# Patient Record
Sex: Male | Born: 1990 | Race: Black or African American | Hispanic: No | Marital: Single | Smoking: Current every day smoker
Health system: Southern US, Community
[De-identification: ages and names within clinical notes are randomized; demographics above are authoritative.]

## PROBLEM LIST (undated history)

## (undated) DIAGNOSIS — I10 Essential (primary) hypertension: Secondary | ICD-10-CM

---
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
## 2008-03-23 ENCOUNTER — Emergency Department (HOSPITAL_COMMUNITY): Admission: EM | Admit: 2008-03-23 | Discharge: 2008-03-23 | Payer: Self-pay | Admitting: Emergency Medicine

## 2008-05-10 ENCOUNTER — Emergency Department (HOSPITAL_COMMUNITY): Admission: EM | Admit: 2008-05-10 | Discharge: 2008-05-10 | Payer: Self-pay | Admitting: Family Medicine

## 2008-06-08 ENCOUNTER — Emergency Department (HOSPITAL_COMMUNITY): Admission: EM | Admit: 2008-06-08 | Discharge: 2008-06-08 | Payer: Self-pay | Admitting: Family Medicine

## 2008-06-09 ENCOUNTER — Emergency Department (HOSPITAL_COMMUNITY): Admission: EM | Admit: 2008-06-09 | Discharge: 2008-06-09 | Payer: Self-pay | Admitting: Emergency Medicine

## 2008-06-11 ENCOUNTER — Emergency Department (HOSPITAL_COMMUNITY): Admission: EM | Admit: 2008-06-11 | Discharge: 2008-06-11 | Payer: Self-pay | Admitting: Family Medicine

## 2010-03-20 ENCOUNTER — Emergency Department (HOSPITAL_COMMUNITY): Admission: EM | Admit: 2010-03-20 | Discharge: 2010-03-20 | Payer: Self-pay | Admitting: Emergency Medicine

## 2011-02-06 ENCOUNTER — Inpatient Hospital Stay (INDEPENDENT_AMBULATORY_CARE_PROVIDER_SITE_OTHER)
Admission: RE | Admit: 2011-02-06 | Discharge: 2011-02-06 | Disposition: A | Discharge status: Home / Self Care | Payer: Self-pay | Source: Ambulatory Visit | Attending: Emergency Medicine | Admitting: Emergency Medicine

## 2011-02-06 ENCOUNTER — Ambulatory Visit (INDEPENDENT_AMBULATORY_CARE_PROVIDER_SITE_OTHER): Payer: Self-pay

## 2011-02-06 DIAGNOSIS — J111 Influenza due to unidentified influenza virus with other respiratory manifestations: Secondary | ICD-10-CM

## 2011-02-06 LAB — POCT RAPID STREP A (OFFICE): Streptococcus, Group A Screen (Direct): NEGATIVE

## 2011-09-02 LAB — CULTURE, ROUTINE-ABSCESS: Gram Stain: NONE SEEN

## 2011-09-04 LAB — HERPES SIMPLEX VIRUS CULTURE

## 2011-09-04 LAB — RPR: RPR Ser Ql: NONREACTIVE

## 2012-07-19 ENCOUNTER — Emergency Department (HOSPITAL_COMMUNITY)
Admission: EM | Admit: 2012-07-19 | Discharge: 2012-07-19 | Disposition: A | Discharge status: Home / Self Care | Payer: No Typology Code available for payment source

## 2012-07-19 ENCOUNTER — Emergency Department (HOSPITAL_COMMUNITY): Payer: No Typology Code available for payment source

## 2012-07-19 ENCOUNTER — Emergency Department (HOSPITAL_COMMUNITY)
Admission: EM | Admit: 2012-07-19 | Discharge: 2012-07-19 | Disposition: A | Discharge status: Home / Self Care | Payer: No Typology Code available for payment source | Attending: Emergency Medicine | Admitting: Emergency Medicine

## 2012-07-19 ENCOUNTER — Encounter (HOSPITAL_COMMUNITY): Payer: Self-pay | Admitting: Emergency Medicine

## 2012-07-19 DIAGNOSIS — Y92410 Unspecified street and highway as the place of occurrence of the external cause: Secondary | ICD-10-CM | POA: Insufficient documentation

## 2012-07-19 DIAGNOSIS — M79609 Pain in unspecified limb: Secondary | ICD-10-CM | POA: Insufficient documentation

## 2012-07-19 DIAGNOSIS — M79642 Pain in left hand: Secondary | ICD-10-CM

## 2012-07-19 DIAGNOSIS — I10 Essential (primary) hypertension: Secondary | ICD-10-CM | POA: Insufficient documentation

## 2012-07-19 DIAGNOSIS — F17200 Nicotine dependence, unspecified, uncomplicated: Secondary | ICD-10-CM | POA: Insufficient documentation

## 2012-07-19 HISTORY — DX: Essential (primary) hypertension: I10

## 2012-07-19 MED ORDER — IBUPROFEN 400 MG PO TABS
800.0000 mg | ORAL_TABLET | Freq: Once | ORAL | Status: AC
  Administered 2012-07-19: 800 mg via ORAL
  Filled 2012-07-19: qty 2

## 2012-07-19 MED ORDER — IBUPROFEN 800 MG PO TABS
800.0000 mg | ORAL_TABLET | Freq: Three times a day (TID) | ORAL | Status: AC | PRN
Start: 2012-07-19 — End: 2012-07-29

## 2012-07-19 NOTE — Progress Notes (Signed)
Orthopedic Tech Progress Note Patient Details:  Sean Deleon 08/23/1991 [id number]  Ortho Devices Type of Ortho Device: Finger splint Ortho Device/Splint Location: left hand;finger splint times two Ortho Device/Splint Interventions: Application   [name] 07/19/2012, 9:05 PM

## 2012-07-19 NOTE — ED Notes (Signed)
Called x1 for triage from lobby. Pt not seen. Will call again
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
## 2012-07-20 NOTE — ED Provider Notes (Signed)
Medical screening examination/treatment/procedure(s) were performed by non-physician practitioner and as supervising physician I was immediately available for consultation/collaboration.  Flint Melter, MD 07/20/12 [phone]

## 2015-01-23 ENCOUNTER — Encounter (HOSPITAL_COMMUNITY): Payer: Self-pay | Admitting: *Deleted

## 2015-01-23 ENCOUNTER — Emergency Department (HOSPITAL_COMMUNITY)
Admission: EM | Admit: 2015-01-23 | Discharge: 2015-01-23 | Disposition: A | Discharge status: Home / Self Care | Payer: Self-pay | Attending: Emergency Medicine | Admitting: Emergency Medicine

## 2015-01-23 DIAGNOSIS — R197 Diarrhea, unspecified: Secondary | ICD-10-CM | POA: Insufficient documentation

## 2015-01-23 DIAGNOSIS — I10 Essential (primary) hypertension: Secondary | ICD-10-CM | POA: Insufficient documentation

## 2015-01-23 DIAGNOSIS — Z880 Allergy status to penicillin: Secondary | ICD-10-CM | POA: Insufficient documentation

## 2015-01-23 DIAGNOSIS — Z720 Tobacco use: Secondary | ICD-10-CM | POA: Insufficient documentation

## 2015-01-23 DIAGNOSIS — R1030 Lower abdominal pain, unspecified: Secondary | ICD-10-CM | POA: Insufficient documentation

## 2015-01-23 DIAGNOSIS — R112 Nausea with vomiting, unspecified: Secondary | ICD-10-CM | POA: Insufficient documentation

## 2015-01-23 LAB — COMPREHENSIVE METABOLIC PANEL
ALK PHOS: 63 U/L (ref 39–117)
ALT: 32 U/L (ref 0–53)
AST: 23 U/L (ref 0–37)
Albumin: 4.4 g/dL (ref 3.5–5.2)
Anion gap: 8 (ref 5–15)
BILIRUBIN TOTAL: 1.9 mg/dL — AB (ref 0.3–1.2)
BUN: 10 mg/dL (ref 6–23)
CHLORIDE: 109 mmol/L (ref 96–112)
CO2: 23 mmol/L (ref 19–32)
Calcium: 9.1 mg/dL (ref 8.4–10.5)
Creatinine, Ser: 0.79 mg/dL (ref 0.50–1.35)
GFR calc Af Amer: >90 mL/min (ref >90)
GLUCOSE: 103 mg/dL — AB (ref 70–99)
Potassium: 3.6 mmol/L (ref 3.5–5.1)
SODIUM: 140 mmol/L (ref 135–145)
Total Protein: 7.3 g/dL (ref 6.0–8.3)

## 2015-01-23 LAB — CBC
HEMATOCRIT: 44.5 % (ref 39.0–52.0)
HEMOGLOBIN: 16.2 g/dL (ref 13.0–17.0)
MCH: 31.2 pg (ref 26.0–34.0)
MCHC: 36.4 g/dL — AB (ref 30.0–36.0)
MCV: 85.7 fL (ref 78.0–100.0)
Platelets: 159 10*3/uL (ref 150–400)
RBC: 5.19 MIL/uL (ref 4.22–5.81)
RDW: 13.3 % (ref 11.5–15.5)
WBC: 18.5 10*3/uL — ABNORMAL HIGH (ref 4.0–10.5)

## 2015-01-23 LAB — I-STAT CG4 LACTIC ACID, ED: Lactic Acid, Venous: 0.71 mmol/L (ref 0.5–2.0)

## 2015-01-23 MED ORDER — ONDANSETRON HCL 4 MG/2ML IJ SOLN
4.0000 mg | Freq: Once | INTRAMUSCULAR | Status: AC
  Administered 2015-01-23: 4 mg via INTRAVENOUS
  Filled 2015-01-23: qty 2

## 2015-01-23 MED ORDER — ONDANSETRON 4 MG PO TBDP: ORAL_TABLET | ORAL | Status: DC

## 2015-01-23 MED ORDER — KETOROLAC TROMETHAMINE 30 MG/ML IJ SOLN
30.0000 mg | Freq: Once | INTRAMUSCULAR | Status: AC
  Administered 2015-01-23: 30 mg via INTRAVENOUS
  Filled 2015-01-23: qty 1

## 2015-01-23 MED ORDER — SODIUM CHLORIDE 0.9 % IV BOLUS (SEPSIS)
1000.0000 mL | Freq: Once | INTRAVENOUS | Status: AC
  Administered 2015-01-23: 1000 mL via INTRAVENOUS

## 2015-01-23 NOTE — Discharge Instructions (Signed)
Nausea and Vomiting °Nausea is a sick feeling that often comes before throwing up (vomiting). Vomiting is a reflex where stomach contents come out of your mouth. Vomiting can cause severe loss of body fluids (dehydration). Children and elderly adults can become dehydrated quickly, especially if they also have diarrhea. Nausea and vomiting are symptoms of a condition or disease. It is important to find the cause of your symptoms. °CAUSES  °· Direct irritation of the stomach lining. This irritation can result from increased acid production (gastroesophageal reflux disease), infection, food poisoning, taking certain medicines (such as nonsteroidal anti-inflammatory drugs), alcohol use, or tobacco use. °· Signals from the brain. These signals could be caused by a headache, heat exposure, an inner ear disturbance, increased pressure in the brain from injury, infection, a tumor, or a concussion, pain, emotional stimulus, or metabolic problems. °· An obstruction in the gastrointestinal tract (bowel obstruction). °· Illnesses such as diabetes, hepatitis, gallbladder problems, appendicitis, kidney problems, cancer, sepsis, atypical symptoms of a heart attack, or eating disorders. °· Medical treatments such as chemotherapy and radiation. °· Receiving medicine that makes you sleep (general anesthetic) during surgery. °DIAGNOSIS °Your caregiver may ask for tests to be done if the problems do not improve after a few days. Tests may also be done if symptoms are severe or if the reason for the nausea and vomiting is not clear. Tests may include: °· Urine tests. °· Blood tests. °· Stool tests. °· Cultures (to look for evidence of infection). °· X-rays or other imaging studies. °Test results can help your caregiver make decisions about treatment or the need for additional tests. °TREATMENT °You need to stay well hydrated. Drink frequently but in small amounts. You may wish to drink water, sports drinks, clear broth, or eat frozen  ice pops or gelatin dessert to help stay hydrated. When you eat, eating slowly may help prevent nausea. There are also some antinausea medicines that may help prevent nausea. °HOME CARE INSTRUCTIONS  °· Take all medicine as directed by your caregiver. °· If you do not have an appetite, do not force yourself to eat. However, you must continue to drink fluids. °· If you have an appetite, eat a normal diet unless your caregiver tells you differently. °¨ Eat a variety of complex carbohydrates (rice, wheat, potatoes, bread), lean meats, yogurt, fruits, and vegetables. °¨ Avoid high-fat foods because they are more difficult to digest. °· Drink enough water and fluids to keep your urine clear or pale yellow. °· If you are dehydrated, ask your caregiver for specific rehydration instructions. Signs of dehydration may include: °¨ Severe thirst. °¨ Dry lips and mouth. °¨ Dizziness. °¨ Dark urine. °¨ Decreasing urine frequency and amount. °¨ Confusion. °¨ Rapid breathing or pulse. °SEEK IMMEDIATE MEDICAL CARE IF:  °· You have blood or brown flecks (like coffee grounds) in your vomit. °· You have black or bloody stools. °· You have a severe headache or stiff neck. °· You are confused. °· You have severe abdominal pain. °· You have chest pain or trouble breathing. °· You do not urinate at least once every 8 hours. °· You develop cold or clammy skin. °· You continue to vomit for longer than 24 to 48 hours. °· You have a fever. °MAKE SURE YOU:  °· Understand these instructions. °· Will watch your condition. °· Will get help right away if you are not doing well or get worse. °Document Released: 11/24/2005 Document Revised: 02/16/2012 Document Reviewed: 04/23/2011 °ExitCare® Patient Information ©2015 [employer]. This information is not intended   to replace advice given to you by your health care provider. Make sure you discuss any questions you have with your health care provider. ° ° °Emergency Department Resource Guide °1) Find a  Doctor and Pay Out of Pocket °Although you won't have to find out who is covered by your insurance plan, it is a good idea to ask around and get recommendations. You will then need to call the office and see if the doctor you have chosen will accept you as a new patient and what types of options they offer for patients who are self-pay. Some doctors offer discounts or will set up payment plans for their patients who do not have insurance, but you will need to ask so you aren't surprised when you get to your appointment. ° °2) Contact Your Local Health Department °Not all health departments have doctors that can see patients for sick visits, but many do, so it is worth a call to see if yours does. If you don't know where your local health department is, you can check in your phone book. The CDC also has a tool to help you locate your state's health department, and many state websites also have listings of all of their local health departments. ° °3) Find a Walk-in Clinic °If your illness is not likely to be very severe or complicated, you may want to try a walk in clinic. These are popping up all over the country in pharmacies, drugstores, and shopping centers. They're usually staffed by nurse practitioners or physician assistants that have been trained to treat common illnesses and complaints. They're usually fairly quick and inexpensive. However, if you have serious medical issues or chronic medical problems, these are probably not your best option. ° °No Primary Care Doctor: °- Call [facility] Connect at  832-8000 - they can help you locate a primary care doctor that  accepts your insurance, provides certain services, etc. °- Physician Referral Service- 1-800-533-3463 ° °Chronic Pain Problems: °Organization         Address  Phone   Notes  °[facility]  (336) 297-2271 Patients need to be referred by their primary care doctor.  ° °Medication Assistance: °Organization         Address  Phone    Notes  °[place] [facility] [address] °[place], [place] [address] (336) 641-8030 --Must be a resident of [place] °-- Must have NO insurance coverage whatsoever (no Medicaid/ Medicare, etc.) °-- The pt. MUST have a primary care doctor that directs their care regularly and follows them in the community °  °[facility]  (866) 331-1348   °[employer]  (888) 892-1162   ° °Agencies that provide inexpensive medical care: °Organization         Address  Phone   Notes  °[facility]  (336) 832-8035   °[facility]    (336) 832-7272   °[facility] [address] °[place], [place] [address] (336) 832-4777   °[facility] [address], °[place] (336) 271-4999   °[facility]    (336) 373-0678   °[facility]    (336) 272-1050   °[facility] ° [address], [place] Phone:  (336) 832-4444, Fax:  (336) 832-4440 Hours of Operation:  9 am - 6 pm, M-F.  Also accepts Medicaid/Medicare and self-pay.  °[facility] ° [address], [place]   Phone: (336) 832-3150, Fax: (336) 832-3151. Hours of Operation:  8:30 am - 5:30 pm, M-F.  Also accepts Medicaid and self-pay.  °[facility] [place] [address], [place] Phone: (336) 878-6027   °[facility] [address], [place], [place] (336)723-1848, Ext. 123 Mondays & Thursdays: 7-9 AM.  First 15 patients are seen on a first come, first serve basis. °  ° °Medicaid-accepting [place] Providers: ° °Organization         Address  Phone   Notes  °[facility] [address], [place] (336) 641-2100 Also accepts self-pay patients.  °[facility] [address], [place] ° (336) 856-9996   °[facility] [address], [place] (336) 288-8857   °[facility] [address] [place] [address], [place] (336) 299-7000   °Veita Bland [address], [place]  ° (336) 373-1557 Only accepts [place] Access Medicaid patients after they have their name applied to their card.  ° °Self-Pay (no insurance) in [place]: ° °Organization         Address  Phone   Notes  °Sickle Cell Patients, [facility] [address], [place] (336) 832-1970   °[facility] [address], [place] (336) 832-4400   °[facility] ° [address], [place] (336) 992-4800   °[facility]/Dr. Osei-Bonsu ° [address] [place] [address], [place] or [address], [place] (336) 841-8500 Phone number for both [place] and [place] locations is the same.  °[facility] [address], [place] (336) 299-0000   °[facility] [address] [place] [address], [place] or [address] (336) 852-7530 °(336) 878-2260   °[facility] [address], [place] (336) 350-1642, phone; (336) 294-5005, fax Sees patients 1st and 3rd Saturday of every month.  Must not qualify for public or private insurance (i.e. Medicaid, Medicare, [place] Health Choice, Veterans' Benefits) • Household income should be no more than 200% of the poverty level •The clinic cannot treat you if you are pregnant or think you are pregnant • Sexually transmitted diseases are not treated at the clinic.  ° ° °Dental Care: °Organization         Address  Phone  Notes  °[facility] [address], [place] (336) 641-6152 Accepts children up to age 21 who are enrolled in Medicaid or [place] Health Choice; pregnant women with a Medicaid card; and children who have applied for Medicaid or [place] Health Choice, but were declined, whose parents can pay a reduced fee at time of service.  °[facility] [place]  [address], [place] (336) 641-7733 Accepts children up to age 21 who are enrolled in Medicaid or [place] Health Choice; pregnant women with a Medicaid card; and children who have applied for Medicaid or [place] Health Choice, but were declined, whose parents can pay a reduced fee at time of service.  °[facility] ° [address], [place] (336) 641-4533 Patients are seen by appointment only. Walk-ins are not accepted. [facility] will see patients 18 years of age and older. °Monday - Tuesday (8am-5pm) °Most Wednesdays (8:30-5pm) °$30 per visit, cash only  °[facility] ° [address], [place] (336) 641-4533 Patients are seen by appointment only. Walk-ins are not accepted. [facility] will see patients 18 years of age and older. °One Wednesday Evening (Monthly: Volunteer Based).  $30 per visit, cash only  °[facility]  (919) 537-3737 for adults; Children under age 4, call Graduate Pediatric Dentistry at (919) [phone]. Children aged 4-14, please call (919) [phone] to request a pediatric application. ° Dental services are provided in all areas of dental care including fillings, crowns and bridges, complete and partial dentures, implants, gum treatment, root canals, and extractions. Preventive care is also provided. Treatment is provided to both adults and children. °Patients are selected via a lottery and there is often a waiting list. °  °[facility] [address], °[place] ° (336) 763-8833 [email or web address] °  °[facility] [address], [place], [place] (336)723-1848, Ext. 123 Second and Fourth Thursday of each month, opens at 6:30 AM; Clinic ends at 9 AM.  Patients are seen on a first-come first-served basis, and a limited number are seen during each clinic.  ° °[facility] ° [address], [place], [place] (336) 723-7904   Eligibility Requirements °You must have lived in  [place], [place], or [place] counties for at least the last three months. °  You cannot be eligible for state or federal sponsored healthcare insurance, including Veterans Administration, Medicaid, or Medicare. °  You generally cannot be eligible for healthcare insurance through your employer.  °  How to apply: °Eligibility screenings are held every Tuesday and Wednesday afternoon from 1:00 pm until 4:00 pm. You do not need an appointment for the interview!  °[facility] [address], [place], [place] 336-631-2330   °[facility]  336-342-8273   °[place]  336-703-3100   °[facility]  336-570-6415   ° °Behavioral Health Resources in the Community: °Intensive Outpatient Programs °Organization         Address  Phone  Notes  °[facility] [address], [place], [place] 336-878-6098   °[facility] [address], [place], [place] 336-832-9800   °[facility] [address], [place], [place] ° 336-882-2125   °[facility] [address],  °[place], [place] 1-800-853-5163 or 336-641-4981   °Substance Abuse Resources °Organization         Address  Phone  Notes  °[facility]  336-882-2125   °[facility]  336-784-9470   °[facility]  336-285-9073   °[facility]  336-845-3988   °Residential & Outpatient Substance Abuse Program  1-800-659-3381   °Psychological Services °Organization         Address  Phone  Notes  °[facility]  336- 832-9600   °[facility]  336- 378-7881   °[facility] [address], [place] 1-800-853-5163 or 336-641-4981   ° °Mobile Crisis Teams °Organization         Address  Phone  Notes  °[facility], Mobile Crisis Care Unit  1-877-626-1772   °Assertive °[facility] ° [address] [place], [place] 336-834-9664   °Sharon DeEsch [address] °[place] 336-554-5454   ° °Self-Help/Support Groups °Organization         Address  Phone             Notes  °[facility] [place] - variety of   support groups  336- 373-1402 Call for more information  °Narcotics Anonymous (NA), [facility] [address], °[place]  2 meetings at this location  ° °Residential Treatment Programs °Organization         Address  Phone  Notes  °[facility] [address],    °[place]  1-866-801-8205   °[facility] ° [address], [place], [place] 704-293-8524   °[facility] [address], [place] 336-845-3988 Admissions: 8am-3pm M-F  °[facility] [address]    °[place], [place] 336-841-1104   °[facility] [address], [place], [place] 336-379-7146   °[facility] [address]  °[place], [place] 336-285-9073   °[facility] Intensive Outpatient [address], [place], [place] 336-852-3033   °[facility] [address]  °[place], [place] 1-877-615-2722 or 336-784-9470   °[facility] [address] [place], [place] 336-227-7417 Accepts Medicaid  °[facility] [address]  °[place] 1-800-659-3381 Substance Abuse/Addiction Treatment  ° °[place] Behavioral Health Resources °Organization         Address  Phone  Notes  °[facility]  (888) 581-9988   °Julie Brannon, PhD [address] [place], [place]   (336) 349-5553 or (336) 951-0000   ° Behavioral   [address] °[place], [place] (336) 349-4454   °[facility] [address], [place], [place] (336) 342-8316 Insurance/Medicaid/sponsorship through [facility]  °[facility] [address]                                    [place], [place] (336) 342-8316 Therapy/tele-psych/case  °[facility] [address]  ° [place], [place] (336)  349-2233    °Dr. Arfeen  (336) 349-4544   °[facility] [place]  [employer] [place] [facility] 1) [address], [place] °2) [address], [place] °3)  [address], [place] (336) 349-3220 °(336) 342-7768 ° °(336) 342-8140   °[place] [facility] (336) 342-1394 or (336) 342-3537 (After Hours)    ° ° ° °

## 2015-01-23 NOTE — ED Notes (Signed)
Patient complains of nausea, vomit, and diarrhea for two days with severe abdominal pains.

## 2015-01-23 NOTE — ED Notes (Signed)
Bed: [id number] Expected date:  Expected time:  Means of arrival:  Comments: EMS- 23yo M, abdominal pain, n/v/d

## 2015-01-23 NOTE — ED Provider Notes (Signed)
CSN: [id number]     Arrival date & time 01/23/15  0844 History   First MD Initiated Contact with Patient 01/23/15 [id number]     Chief Complaint  Patient presents with  . Nausea  . Emesis  . Diarrhea     (Consider location/radiation/quality/duration/timing/severity/associated sxs/prior Treatment) Patient is a 24 y.o. male presenting with vomiting and diarrhea. The history is provided by the patient.  Emesis Severity:  Moderate Duration:  2 days Timing:  Constant Quality:  Stomach contents Progression:  Worsening Chronicity:  New Recent urination:  Normal Relieved by:  Nothing Worsened by:  Nothing tried Associated symptoms: abdominal pain and diarrhea   Associated symptoms: no chills   Diarrhea Associated symptoms: abdominal pain and vomiting   Associated symptoms: no chills and no fever     Past Medical History  Diagnosis Date  . Hypertension    History reviewed. No pertinent past surgical history. History reviewed. No pertinent family history. History  Substance Use Topics  . Smoking status: Current Every Day Smoker -- 0.50 packs/day  . Smokeless tobacco: Not on file  . Alcohol Use: No    Review of Systems  Constitutional: Negative for fever and chills.  Respiratory: Negative for cough and shortness of breath.   Gastrointestinal: Positive for nausea, vomiting, abdominal pain and diarrhea.  All other systems reviewed and are negative.     Allergies  Penicillins  Home Medications   Prior to Admission medications   Not on File   BP 123/106 mmHg  Pulse 100  Temp(Src) 98 F (36.7 C) (Oral)  SpO2 100% Physical Exam  Constitutional: He is oriented to person, place, and time. He appears well-developed and well-nourished. No distress.  HENT:  Head: Normocephalic and atraumatic.  Mouth/Throat: No oropharyngeal exudate.  Eyes: EOM are normal. Pupils are equal, round, and reactive to light.  Neck: Normal range of motion. Neck supple.  Cardiovascular: Normal  rate and regular rhythm.  Exam reveals no friction rub.   No murmur heard. Pulmonary/Chest: Effort normal and breath sounds normal. No respiratory distress. He has no wheezes. He has no rales.  Abdominal: He exhibits no distension. There is tenderness (mild, lower abdomen). There is no rebound.  Musculoskeletal: Normal range of motion. He exhibits no edema.  Neurological: He is alert and oriented to person, place, and time.  Skin: He is not diaphoretic.  Nursing note and vitals reviewed.   ED Course  Procedures (including critical care time) Labs Review Labs Reviewed  CBC - Abnormal; Notable for the following:    WBC 18.5 (*)    MCHC 36.4 (*)    All other components within normal limits  COMPREHENSIVE METABOLIC PANEL - Abnormal; Notable for the following:    Glucose, Bld 103 (*)    Total Bilirubin 1.9 (*)    All other components within normal limits  I-STAT CG4 LACTIC ACID, ED    Imaging Review No results found.   EKG Interpretation None      MDM   Final diagnoses:  Nausea vomiting and diarrhea    43M presents with N/V/D. Present for 2 days. No recent travel or antibiotic use. Daughter and girlfriend have both had similar illness. No syncope, no fever. NBNB vomiting, nonbloody diarrhea. On exam, mild diffuse abdominal pain. Will check labs, give fluids. Symptoms improved after fluids, zofran. Given zofran to go home. Stable for discharge.  Elwin MochaBlair Lalaine Overstreet, MD 01/23/15 [phone]

## 2015-12-06 ENCOUNTER — Emergency Department (HOSPITAL_COMMUNITY): Payer: Self-pay

## 2015-12-06 ENCOUNTER — Encounter (HOSPITAL_COMMUNITY): Payer: Self-pay | Admitting: *Deleted

## 2015-12-06 ENCOUNTER — Emergency Department (HOSPITAL_COMMUNITY)
Admission: EM | Admit: 2015-12-06 | Discharge: 2015-12-06 | Disposition: A | Discharge status: Home / Self Care | Payer: Self-pay | Attending: Emergency Medicine | Admitting: Emergency Medicine

## 2015-12-06 ENCOUNTER — Emergency Department (HOSPITAL_COMMUNITY): Payer: No Typology Code available for payment source

## 2015-12-06 DIAGNOSIS — J40 Bronchitis, not specified as acute or chronic: Secondary | ICD-10-CM | POA: Insufficient documentation

## 2015-12-06 DIAGNOSIS — F17200 Nicotine dependence, unspecified, uncomplicated: Secondary | ICD-10-CM | POA: Insufficient documentation

## 2015-12-06 DIAGNOSIS — Z880 Allergy status to penicillin: Secondary | ICD-10-CM | POA: Insufficient documentation

## 2015-12-06 DIAGNOSIS — I10 Essential (primary) hypertension: Secondary | ICD-10-CM | POA: Insufficient documentation

## 2015-12-06 MED ORDER — BENZONATATE 100 MG PO CAPS: 100.0000 mg | ORAL_CAPSULE | Freq: Three times a day (TID) | ORAL | Status: DC

## 2015-12-06 MED ORDER — ALBUTEROL SULFATE HFA 108 (90 BASE) MCG/ACT IN AERS
2.0000 | INHALATION_SPRAY | RESPIRATORY_TRACT | Status: DC | PRN
  Administered 2015-12-06: 2 via RESPIRATORY_TRACT
  Filled 2015-12-06: qty 6.7

## 2015-12-06 MED ORDER — AEROCHAMBER Z-STAT PLUS/MEDIUM MISC
1.0000 | Freq: Once | Status: AC
  Administered 2015-12-06: 1
  Filled 2015-12-06: qty 1

## 2015-12-06 NOTE — Discharge Instructions (Signed)
Please read and follow all provided instructions.  Your diagnoses today include:  1. Bronchitis    Tests performed today include:  Chest x-ray - does not show any pneumonia  Vital signs. See below for your results today.   Medications prescribed:   Albuterol inhaler - medication that opens up your airway  Use inhaler as follows: 1-2 puffs with spacer every 4 hours as needed for wheezing, cough, or shortness of breath.    Tessalon Perles - cough suppressant medication  Take any prescribed medications only as directed.  Home care instructions:  Follow any educational materials contained in this packet.  Follow-up instructions: Please follow-up with your primary care provider in the next 3 days for further evaluation of your symptoms and a recheck if you are not feeling better.   Return instructions:   Please return to the Emergency Department if you experience worsening symptoms.  Please return with worsening wheezing, shortness of breath, or difficulty breathing.  Return with persistent fever above 101F.   Please return if you have any other emergent concerns.  Additional Information:  Your vital signs today were: BP 146/93 mmHg   Pulse 104   Resp 16   SpO2 93% If your blood pressure (BP) was elevated above 135/85 this visit, please have this repeated by your doctor within one month. --------------

## 2015-12-06 NOTE — ED Provider Notes (Signed)
CSN: [id number]     Arrival date & time 12/06/15  1251 History  By signing my name below, I, Evon Slackerrance Branch, attest that this documentation has been prepared under the direction and in the presence of Renne CriglerJoshua Obdulio Mash, PA-C. Electronically Signed: Evon Slackerrance Branch, ED Scribe. 12/06/2015. 1:58 PM.     Chief Complaint  Patient presents with  . Cough   Patient is a 24 y.o. male presenting with cough. The history is provided by the patient. No language interpreter was used.  Cough Associated symptoms: wheezing   Associated symptoms: no chills, no ear pain, no fever, no headaches, no myalgias, no rash, no rhinorrhea, no shortness of breath and no sore throat    HPI Comments: Sean Deleon is a 24 y.o. male who presents to the Emergency Department complaining of productive cough of yellow sputum onset 2 days prior. Pt reports associated congestion and wheezing. Pt reports chest wall tenderness brought on from coughing. Pt states that he has tried mucinex with some relief. Denies fever, abdominal pain or SOB. Denies Hx of asthma.   Past Medical History  Diagnosis Date  . Hypertension    History reviewed. No pertinent past surgical history. History reviewed. No pertinent family history. Social History  Substance Use Topics  . Smoking status: Current Every Day Smoker -- 0.50 packs/day  . Smokeless tobacco: None  . Alcohol Use: No    Review of Systems  Constitutional: Negative for fever, chills and fatigue.  HENT: Positive for congestion. Negative for ear pain, rhinorrhea, sinus pressure and sore throat.   Eyes: Negative for redness.  Respiratory: Positive for cough and wheezing. Negative for shortness of breath.   Gastrointestinal: Negative for nausea, vomiting, abdominal pain and diarrhea.  Genitourinary: Negative for dysuria.  Musculoskeletal: Negative for myalgias and neck stiffness.  Skin: Negative for rash.  Neurological: Negative for headaches.  Hematological: Negative for  adenopathy.    Allergies  Penicillins  Home Medications   Prior to Admission medications   Medication Sig Start Date End Date Taking? Authorizing Provider  ondansetron (ZOFRAN ODT) 4 MG disintegrating tablet 4mg  ODT q6 hours prn nausea/vomit 01/23/15   Elwin MochaBlair Walden, MD   BP 146/93 mmHg  Pulse 104  Temp(Src) 98.4 F (36.9 C) (Oral)  Resp 16  SpO2 93%   Physical Exam  Constitutional: He is oriented to person, place, and time. He appears well-developed and well-nourished. No distress.  HENT:  Head: Normocephalic and atraumatic.  Right Ear: Tympanic membrane, external ear and ear canal normal.  Left Ear: Tympanic membrane, external ear and ear canal normal.  Nose: Nose normal. No mucosal edema or rhinorrhea.  Mouth/Throat: Uvula is midline, oropharynx is clear and moist and mucous membranes are normal. Mucous membranes are not dry. No trismus in the jaw. No uvula swelling. No oropharyngeal exudate, posterior oropharyngeal edema, posterior oropharyngeal erythema or tonsillar abscesses.  Eyes: Conjunctivae and EOM are normal. Right eye exhibits no discharge. Left eye exhibits no discharge.  Neck: Normal range of motion. Neck supple. No tracheal deviation present.  Cardiovascular: Normal rate, regular rhythm and normal heart sounds.   Pulmonary/Chest: Effort normal and breath sounds normal. No respiratory distress. He has no wheezes. He has no rales.  Frequent coughing during exam  Abdominal: Soft. There is no tenderness.  Musculoskeletal: Normal range of motion.  Neurological: He is alert and oriented to person, place, and time.  Skin: Skin is warm and dry.  Psychiatric: He has a normal mood and affect. His behavior is normal.  Nursing note and vitals reviewed.   ED Course  Procedures (including critical care time) DIAGNOSTIC STUDIES: Oxygen Saturation is 93% on RA, adequate by my interpretation.    COORDINATION OF CARE: 1:58 PM-Discussed treatment plan with pt at bedside and  pt agreed to plan.   Labs Review Labs Reviewed - No data to display  Imaging Review Dg Chest 2 View  12/06/2015  CLINICAL DATA:  Productive cough and mid chest pain.  Smoker. EXAM: CHEST  2 VIEW COMPARISON:  02/06/2011 chest radiograph. FINDINGS: Stable cardiomediastinal silhouette with normal heart size. No pneumothorax. No pleural effusion. Clear lungs, with no focal lung consolidation and no pulmonary edema. IMPRESSION: No active cardiopulmonary disease. Electronically Signed   By: Delbert Phenix M.D.   On: 12/06/2015 13:49   I have personally reviewed and evaluated these images results as part of my medical decision-making.   EKG Interpretation None       Patient seen and examined. Given wheezing, will discharge to home with albuterol.  Vital signs reviewed and are as follows: BP 146/93 mmHg  Pulse 104  Temp(Src) 98.4 F (36.9 C) (Oral)  Resp 16  SpO2 93%  Patient counseled on use of albuterol HFA. Instructed to use 1-2 puffs q 4 hours as needed for SOB.  Patient urged to return with worsening symptoms, fever, worsening difficulty breathing, increased work of breathing or shortness of breath, or other concerns. Patient verbalized understanding and agrees with plan.    MDM   Final diagnoses:  Bronchitis   Patient with likely viral bronchitis. No concern for PNA given normal lung exam/x-ray. Antibiotics not indicated. Conservative therapy indicated.      I personally performed the services described in this documentation, which was scribed in my presence. The recorded information has been reviewed and is accurate.      Renne Crigler, PA-C 12/06/15 1441  Cathren Laine, MD 12/08/15 [phone]

## 2015-12-06 NOTE — Progress Notes (Signed)
Pt has been coughing times 2 days. He states thick yellow is coming up. Pt also has fevers as well.

## 2015-12-06 NOTE — ED Notes (Signed)
Pt c/o productive cough, chest wall pain reproducible with palpation, chills.

## 2016-07-31 ENCOUNTER — Encounter (HOSPITAL_COMMUNITY): Payer: Self-pay | Admitting: Emergency Medicine

## 2016-07-31 DIAGNOSIS — N451 Epididymitis: Secondary | ICD-10-CM | POA: Insufficient documentation

## 2016-07-31 DIAGNOSIS — F1290 Cannabis use, unspecified, uncomplicated: Secondary | ICD-10-CM | POA: Insufficient documentation

## 2016-07-31 DIAGNOSIS — F17200 Nicotine dependence, unspecified, uncomplicated: Secondary | ICD-10-CM | POA: Insufficient documentation

## 2016-07-31 DIAGNOSIS — I10 Essential (primary) hypertension: Secondary | ICD-10-CM | POA: Insufficient documentation

## 2016-07-31 NOTE — ED Triage Notes (Signed)
Pt c/o L testicle pain since last night. Pt sts he's been sleeping on the floor and thinks he rolled over on it. Pt c/o intense severe pain. Pt c/o swelling in L side. Pt sts pain disappears when he is walking. Pt c/o slight swelling.

## 2016-07-31 NOTE — ED Triage Notes (Signed)
Per EMS, Pt c/o L testicular pain x 1 day with swelling to the L side. Denies injury. Pt ambulatory. A&Ox4.

## 2016-08-01 ENCOUNTER — Emergency Department (HOSPITAL_COMMUNITY): Payer: Self-pay

## 2016-08-01 ENCOUNTER — Emergency Department (HOSPITAL_COMMUNITY)
Admission: EM | Admit: 2016-08-01 | Discharge: 2016-08-01 | Disposition: A | Discharge status: Home / Self Care | Payer: Self-pay | Attending: Emergency Medicine | Admitting: Emergency Medicine

## 2016-08-01 DIAGNOSIS — N451 Epididymitis: Secondary | ICD-10-CM

## 2016-08-01 LAB — URINALYSIS, ROUTINE W REFLEX MICROSCOPIC
Bilirubin Urine: NEGATIVE
Glucose, UA: NEGATIVE mg/dL
Nitrite: NEGATIVE
PROTEIN: NEGATIVE mg/dL
Specific Gravity, Urine: 1.014 (ref 1.005–1.030)
pH: 6 (ref 5.0–8.0)

## 2016-08-01 LAB — URINE MICROSCOPIC-ADD ON: Squamous Epithelial / LPF: NONE SEEN

## 2016-08-01 LAB — GC/CHLAMYDIA PROBE AMP (~~LOC~~) NOT AT ARMC
CHLAMYDIA, DNA PROBE: POSITIVE — AB
Neisseria Gonorrhea: NEGATIVE

## 2016-08-01 MED ORDER — IBUPROFEN 800 MG PO TABS: 800.0000 mg | ORAL_TABLET | Freq: Three times a day (TID) | ORAL | 0 refills | Status: DC

## 2016-08-01 MED ORDER — OXYCODONE-ACETAMINOPHEN 5-325 MG PO TABS: 1.0000 | ORAL_TABLET | ORAL | 0 refills | Status: DC | PRN

## 2016-08-01 MED ORDER — OXYCODONE-ACETAMINOPHEN 5-325 MG PO TABS
2.0000 | ORAL_TABLET | Freq: Once | ORAL | Status: AC
Start: 2016-08-01 — End: 2016-08-01
  Administered 2016-08-01: 2 via ORAL
  Filled 2016-08-01: qty 2

## 2016-08-01 MED ORDER — DOXYCYCLINE HYCLATE 100 MG PO CAPS: 100.0000 mg | ORAL_CAPSULE | Freq: Two times a day (BID) | ORAL | 0 refills | Status: DC

## 2016-08-01 NOTE — ED Provider Notes (Signed)
[facility]-EMERGENCY DEPT Provider Note   CSN: [id number] Arrival date & time: 07/31/16  2322  By signing my name below, I, Majel Homer, attest that this documentation has been prepared under the direction and in the presence of Gilda Crease, MD . Electronically Signed: Majel Homer, Scribe. 08/01/2016. 12:55 AM.  History   Chief Complaint Chief Complaint  Patient presents with  . Testicle Pain   The history is provided by the patient. No language interpreter was used.   HPI Comments: JONAVEN HILGERS is a 25 y.o. male with PMHx of HTN, who presents to the Emergency Department complaining of gradual onset, gradually worsening, left testicle pain that began yesterday. Pt reports swelling to his left testicle and states it feels as if he "got kicked down there." He notes he has been sleeping on the floor lately and believes he may have rolled over on it. He states this is the first time he has experienced these symptoms. Pt reports mild relief of pain with ice. He denies difficulty urinating, dysuria, hematuria and FHx of twisting of testicles. He states he is sexually active.   Past Medical History:  Diagnosis Date  . Hypertension    There are no active problems to display for this patient.  History reviewed. No pertinent surgical history.   Home Medications    Prior to Admission medications   Medication Sig Start Date End Date Taking? Authorizing Provider  benzonatate (TESSALON) 100 MG capsule Take 1 capsule (100 mg total) by mouth every 8 (eight) hours. Patient not taking: Reported on 08/01/2016 12/06/15   Renne Crigler, PA-C  ondansetron (ZOFRAN ODT) 4 MG disintegrating tablet 4mg  ODT q6 hours prn nausea/vomit Patient not taking: Reported on 08/01/2016 01/23/15   Elwin Mocha, MD    Family History No family history on file.  Social History Social History  Substance Use Topics  . Smoking status: Current Every Day Smoker    Packs/day: 0.50  . Smokeless tobacco: Not on file    . Alcohol use No     Allergies   Penicillins   Review of Systems Review of Systems  Genitourinary: Positive for testicular pain. Negative for difficulty urinating, dysuria and hematuria.   Physical Exam Updated Vital Signs BP 146/89 (BP Location: Right Arm)   Pulse 101   Temp 98.5 F (36.9 C) (Oral)   Resp 18   Ht 5\' 11"  (1.803 m)   Wt 190 lb (86.2 kg)   SpO2 98%   BMI 26.50 kg/m   Physical Exam  Constitutional: He is oriented to person, place, and time. He appears well-developed and well-nourished. No distress.  HENT:  Head: Normocephalic and atraumatic.  Right Ear: Hearing normal.  Left Ear: Hearing normal.  Nose: Nose normal.  Mouth/Throat: Oropharynx is clear and moist and mucous membranes are normal.  Eyes: Conjunctivae and EOM are normal. Pupils are equal, round, and reactive to light.  Neck: Normal range of motion. Neck supple.  Cardiovascular: Regular rhythm, S1 normal and S2 normal.  Exam reveals no gallop and no friction rub.   No murmur heard. Pulmonary/Chest: Effort normal and breath sounds normal. No respiratory distress. He exhibits no tenderness.  Abdominal: Soft. Normal appearance and bowel sounds are normal. There is no hepatosplenomegaly. There is no tenderness. There is no rebound, no guarding, no tenderness at McBurney's point and negative Murphy's sign. No hernia.  Genitourinary:  Genitourinary Comments: Left testicle tenderness without swelling or mass   Musculoskeletal: Normal range of motion.  Neurological: He is alert  and oriented to person, place, and time. He has normal strength. No cranial nerve deficit or sensory deficit. Coordination normal. GCS eye subscore is 4. GCS verbal subscore is 5. GCS motor subscore is 6.  Skin: Skin is warm, dry and intact. No rash noted. No cyanosis.  Psychiatric: He has a normal mood and affect. His speech is normal and behavior is normal. Thought content normal.  Nursing note and vitals reviewed.   ED  Treatments / Results  Labs (all labs ordered are listed, but only abnormal results are displayed) Labs Reviewed - No data to display  EKG  EKG Interpretation None       Radiology US Scrotum  Result Date: 08/01/2016 CLINICAL DATA:  Left testicular pain for 2 days.  WBC 18.5K. EXAM: SCROTAL ULTRASOUND DOPPLER ULTRASOUND OF THE TESTICLES TECHNIQUE: Complete ultrasound examination of the testicles, epididymis, and other scrotal structures was performed. Color and spectral Doppler ultrasound were also utilized to evaluate blood flow to the testicles. COMPARISON:  None. FINDINGS: Right testicle Measurements: 41 x 24 x 31 mm. Negative for mass. Limited microlithiasis. Left testicle Measurements: 34 x 23 x 29 mm. Negative for mass. Limited microlithiasis. Right epididymis:  Normal in size and appearance. Left epididymis: Along the lower epididymis there is soft tissue thickening, hypervascularity, and a central 14 mm nonvascular structure that appears cystic with internal echoes. Hydrocele:  Trace fluid on the left.  No pyoocele. Varicocele:  None visualized. Pulsed Doppler interrogation of both testes demonstrates normal low resistance arterial and venous waveforms bilaterally. IMPRESSION: 1. 14 mm complex fluid collection in the lower left sac, likely abscess from complicated epididymitis. 2. No orchitis.  Normal testicular blood flow. Electronically Signed   By: Marnee Spring M.D.   On: 08/01/2016 02:05   [place] Art/ven Flow Abd Pelv Doppler  Result Date: 08/01/2016 CLINICAL DATA:  Left testicular pain for 2 days.  WBC 18.5K. EXAM: SCROTAL ULTRASOUND DOPPLER ULTRASOUND OF THE TESTICLES TECHNIQUE: Complete ultrasound examination of the testicles, epididymis, and other scrotal structures was performed. Color and spectral Doppler ultrasound were also utilized to evaluate blood flow to the testicles. COMPARISON:  None. FINDINGS: Right testicle Measurements: 41 x 24 x 31 mm. Negative for mass. Limited  microlithiasis. Left testicle Measurements: 34 x 23 x 29 mm. Negative for mass. Limited microlithiasis. Right epididymis:  Normal in size and appearance. Left epididymis: Along the lower epididymis there is soft tissue thickening, hypervascularity, and a central 14 mm nonvascular structure that appears cystic with internal echoes. Hydrocele:  Trace fluid on the left.  No pyoocele. Varicocele:  None visualized. Pulsed Doppler interrogation of both testes demonstrates normal low resistance arterial and venous waveforms bilaterally. IMPRESSION: 1. 14 mm complex fluid collection in the lower left sac, likely abscess from complicated epididymitis. 2. No orchitis.  Normal testicular blood flow. Electronically Signed   By: Marnee Spring M.D.   On: 08/01/2016 02:05   Procedures Procedures  DIAGNOSTIC STUDIES:  Oxygen Saturation is 98% on RA, normal by my interpretation.    COORDINATION OF CARE:  12:48 AM Discussed treatment plan with pt at bedside and pt agreed to plan.  Medications Ordered in ED Medications - No data to display   Initial Impression / Assessment and Plan / ED Course  I have reviewed the triage vital signs and the nursing notes.  Pertinent labs & imaging results that were available during my care of the patient were reviewed by me and considered in my medical decision making (see chart for details).  Clinical Course   Patient presents to the emergency department for evaluation of left testicular pain has been ongoing for 2 days. Ultrasound shows evidence of complicated epididymitis with 1.4 cm abscess. This was discussed with Dr. Mackenzie, The[facility]versmithcall for urology. He recommends 3 weeks of doxycycline, will follow up in office.  I personally performed the services described in this documentation, which was scribed in my presence. The recorded information has been reviewed and is accurate.   Final Clinical Impressions(s) / ED Diagnoses   Final diagnoses:  None  Epididymitis  New  Prescriptions New Prescriptions   No medications on file     Gilda Creasehristopher J Pollina, MD 08/01/16 0330

## 2016-08-04 ENCOUNTER — Telehealth (HOSPITAL_BASED_OUTPATIENT_CLINIC_OR_DEPARTMENT_OTHER): Payer: Self-pay | Admitting: Emergency Medicine

## 2016-09-30 ENCOUNTER — Telehealth (HOSPITAL_BASED_OUTPATIENT_CLINIC_OR_DEPARTMENT_OTHER): Payer: Self-pay | Admitting: Emergency Medicine

## 2016-09-30 NOTE — Telephone Encounter (Signed)
Lost to followup 

## 2017-03-12 ENCOUNTER — Emergency Department (HOSPITAL_COMMUNITY)
Admission: EM | Admit: 2017-03-12 | Discharge: 2017-03-12 | Disposition: A | Discharge status: Home / Self Care | Payer: Self-pay | Attending: Emergency Medicine | Admitting: Emergency Medicine

## 2017-03-12 ENCOUNTER — Encounter (HOSPITAL_COMMUNITY): Payer: Self-pay

## 2017-03-12 ENCOUNTER — Emergency Department (HOSPITAL_COMMUNITY): Payer: Self-pay

## 2017-03-12 DIAGNOSIS — I10 Essential (primary) hypertension: Secondary | ICD-10-CM | POA: Insufficient documentation

## 2017-03-12 DIAGNOSIS — F17200 Nicotine dependence, unspecified, uncomplicated: Secondary | ICD-10-CM | POA: Insufficient documentation

## 2017-03-12 DIAGNOSIS — J208 Acute bronchitis due to other specified organisms: Secondary | ICD-10-CM

## 2017-03-12 DIAGNOSIS — J069 Acute upper respiratory infection, unspecified: Secondary | ICD-10-CM

## 2017-03-12 MED ORDER — GUAIFENESIN ER 600 MG PO TB12: 1200.0000 mg | ORAL_TABLET | Freq: Two times a day (BID) | ORAL | 0 refills | Status: DC

## 2017-03-12 MED ORDER — BENZONATATE 100 MG PO CAPS: 100.0000 mg | ORAL_CAPSULE | Freq: Three times a day (TID) | ORAL | 0 refills | Status: DC | PRN

## 2017-03-12 MED ORDER — FLUTICASONE PROPIONATE 50 MCG/ACT NA SUSP: 2.0000 | Freq: Every day | NASAL | 0 refills | Status: DC

## 2017-03-12 MED ORDER — AEROCHAMBER PLUS FLO-VU MEDIUM MISC
1.0000 | Freq: Once | Status: AC
  Administered 2017-03-12: 1
  Filled 2017-03-12: qty 1

## 2017-03-12 MED ORDER — ALBUTEROL SULFATE HFA 108 (90 BASE) MCG/ACT IN AERS
2.0000 | INHALATION_SPRAY | RESPIRATORY_TRACT | Status: DC | PRN
  Administered 2017-03-12: 2 via RESPIRATORY_TRACT
  Filled 2017-03-12: qty 6.7

## 2017-03-12 NOTE — ED Triage Notes (Signed)
Pt complains of a productive cough for two days, pt says it's really thick and sometimes it feels like it's stuck in his throat

## 2017-03-12 NOTE — Discharge Instructions (Signed)
1. Medications: flonase, mucinex, tessalon, albuterol, usual home medications °2. Treatment: rest, drink plenty of fluids, take tylenol or ibuprofen for fever control °3. Follow Up: Please followup with your primary doctor in 3 days for discussion of your diagnoses and further evaluation after today's visit; if you do not have a primary care doctor use the resource guide provided to find one; Return to the ER for high fevers, difficulty breathing or other concerning symptoms ° °

## 2017-03-12 NOTE — ED Provider Notes (Signed)
[facility]-EMERGENCY DEPT Provider Note   CSN: [id number] Arrival date & time: 03/12/17  0035     History   Chief Complaint Chief Complaint  Patient presents with  . Nasal Congestion    HPI Sean Deleon is a 26 y.o. male with a hx of HTN presents to the Emergency Department complaining of gradual, persistent, progressively worsening Cough with associated rhinorrhea and postnasal drip onset yesterday. No treatments prior to arrival. No aggravating or alleviating factors. Patient reports he smokes approximately one half pack per day and intermittently marijuana.  Pt denies ears, chills, nausea, vomiting, headache, neck pain, neck stiffness, rash. He denies recent international travel or sick contacts.     The history is provided by the patient and medical records. No language interpreter was used.    Past Medical History:  Diagnosis Date  . Hypertension     There are no active problems to display for this patient.   History reviewed. No pertinent surgical history.     Home Medications    Prior to Admission medications   Medication Sig Start Date End Date Taking? Authorizing Provider  benzonatate (TESSALON PERLES) 100 MG capsule Take 1 capsule (100 mg total) by mouth 3 (three) times daily as needed for cough (cough). 03/12/17   Rhilynn Preyer, PA-C  doxycycline (VIBRAMYCIN) 100 MG capsule Take 1 capsule (100 mg total) by mouth 2 (two) times daily. 08/01/16   Gilda Crease, MD  fluticasone (FLONASE) 50 MCG/ACT nasal spray Place 2 sprays into both nostrils daily. 03/12/17   Timothy Townsel, PA-C  guaiFENesin (MUCINEX) 600 MG 12 hr tablet Take 2 tablets (1,200 mg total) by mouth 2 (two) times daily. 03/12/17   Tilford Deaton, PA-C  ibuprofen (ADVIL,MOTRIN) 800 MG tablet Take 1 tablet (800 mg total) by mouth 3 (three) times daily. 08/01/16   Gilda Crease, MD  ondansetron (ZOFRAN ODT) 4 MG disintegrating tablet  ODT q6 hours prn nausea/vomit Patient not  taking: Reported on 08/01/2016 01/23/15   Elwin Mocha, MD  oxyCODONE-acetaminophen (PERCOCET) 5-325 MG tablet Take 1 tablet by mouth every 4 (four) hours as needed. 08/01/16   Gilda Crease, MD    Family History History reviewed. No pertinent family history.  Social History Social History  Substance Use Topics  . Smoking status: Current Every Day Smoker    Packs/day: 0.50  . Smokeless tobacco: Never Used  . Alcohol use No     Allergies   Penicillins   Review of Systems Review of Systems  HENT: Positive for congestion, postnasal drip and rhinorrhea.   Respiratory: Positive for cough.   All other systems reviewed and are negative.    Physical Exam Updated Vital Signs BP (!) 152/107 (BP Location: Left Arm)   Pulse (!) 109   Temp 98.4 F (36.9 C) (Oral)   Resp 18   SpO2 99%   Physical Exam  Constitutional: He appears well-developed and well-nourished. No distress.  HENT:  Head: Normocephalic and atraumatic.  Right Ear: Tympanic membrane, external ear and ear canal normal.  Left Ear: Tympanic membrane, external ear and ear canal normal.  Nose: Mucosal edema and rhinorrhea present. No epistaxis. Right sinus exhibits no maxillary sinus tenderness and no frontal sinus tenderness. Left sinus exhibits no maxillary sinus tenderness and no frontal sinus tenderness.  Mouth/Throat: Uvula is midline and mucous membranes are normal. Mucous membranes are not pale and not cyanotic. No oropharyngeal exudate, posterior oropharyngeal edema, posterior oropharyngeal erythema or tonsillar abscesses.  Eyes: Conjunctivae are normal. Pupils  are equal, round, and reactive to light.  Neck: Normal range of motion and full passive range of motion without pain.  Cardiovascular: Normal rate and intact distal pulses.   Pulmonary/Chest: Effort normal. No stridor.  Course breath sounds throughout No focal wheezes, rhonchi or rales  Abdominal: Soft. There is no tenderness.  Musculoskeletal:  Normal range of motion.  Lymphadenopathy:    He has no cervical adenopathy.  Neurological: He is alert.  Skin: Skin is warm and dry. No rash noted. He is not diaphoretic.  Psychiatric: He has a normal mood and affect.  Nursing note and vitals reviewed.    ED Treatments / Results    Radiology Dg Chest 2 View  Result Date: 03/12/2017 CLINICAL DATA:  Productive cough for 3 days. EXAM: CHEST  2 VIEW COMPARISON:  12/06/2015 FINDINGS: The heart size and mediastinal contours are within normal limits. Both lungs are clear. The visualized skeletal structures are unremarkable. IMPRESSION: No active cardiopulmonary disease. Electronically Signed   By: Ellery Plunk M.D.   On: 03/12/2017 02:17    Procedures Procedures (including critical care time)  Medications Ordered in ED Medications  albuterol (PROVENTIL HFA;VENTOLIN HFA) 108 (90 Base) MCG/ACT inhaler 2 puff (not administered)  AEROCHAMBER PLUS FLO-VU MEDIUM MISC 1 each (not administered)     Initial Impression / Assessment and Plan / ED Course  I have reviewed the triage vital signs and the nursing notes.  Pertinent labs & imaging results that were available during my care of the patient were reviewed by me and considered in my medical decision making (see chart for details).     Pt CXR negative for acute infiltrate. Patients symptoms are consistent with URI, likely viral etiology. Discussed that antibiotics are not indicated for viral infections. Pt will be discharged with symptomatic treatment including albuterol inhaler as he has a predisposition to wheeze with smoking history.  Verbalizes understanding and is agreeable with plan. Pt is hemodynamically stable & in NAD prior to dc.   Final Clinical Impressions(s) / ED Diagnoses   Final diagnoses:  Upper respiratory tract infection, unspecified type  Viral bronchitis    New Prescriptions New Prescriptions   BENZONATATE (TESSALON PERLES) 100 MG CAPSULE    Take 1 capsule  (100 mg total) by mouth 3 (three) times daily as needed for cough (cough).   FLUTICASONE (FLONASE) 50 MCG/ACT NASAL SPRAY    Place 2 sprays into both nostrils daily.   GUAIFENESIN (MUCINEX) 600 MG 12 HR TABLET    Take 2 tablets (1,200 mg total) by mouth 2 (two) times daily.     Dahlia Client Kumari Sculley, PA-C 03/12/17 4098    Paula Libra, MD 03/12/17 [phone]

## 2017-03-14 ENCOUNTER — Emergency Department (HOSPITAL_COMMUNITY)
Admission: EM | Admit: 2017-03-14 | Discharge: 2017-03-14 | Disposition: A | Discharge status: Home / Self Care | Payer: Self-pay | Attending: Physician Assistant | Admitting: Physician Assistant

## 2017-03-14 DIAGNOSIS — Z79899 Other long term (current) drug therapy: Secondary | ICD-10-CM | POA: Insufficient documentation

## 2017-03-14 DIAGNOSIS — F17200 Nicotine dependence, unspecified, uncomplicated: Secondary | ICD-10-CM | POA: Insufficient documentation

## 2017-03-14 DIAGNOSIS — K2900 Acute gastritis without bleeding: Secondary | ICD-10-CM | POA: Insufficient documentation

## 2017-03-14 DIAGNOSIS — I10 Essential (primary) hypertension: Secondary | ICD-10-CM | POA: Insufficient documentation

## 2017-03-14 LAB — COMPREHENSIVE METABOLIC PANEL
ALT: 18 U/L (ref 17–63)
AST: 31 U/L (ref 15–41)
Albumin: 5.1 g/dL — ABNORMAL HIGH (ref 3.5–5.0)
Alkaline Phosphatase: 70 U/L (ref 38–126)
Anion gap: 13 (ref 5–15)
BUN: 10 mg/dL (ref 6–20)
CHLORIDE: 99 mmol/L — AB (ref 101–111)
CO2: 25 mmol/L (ref 22–32)
Calcium: 9.8 mg/dL (ref 8.9–10.3)
Creatinine, Ser: 0.96 mg/dL (ref 0.61–1.24)
Glucose, Bld: 99 mg/dL (ref 65–99)
POTASSIUM: 3.2 mmol/L — AB (ref 3.5–5.1)
SODIUM: 137 mmol/L (ref 135–145)
Total Bilirubin: 2.6 mg/dL — ABNORMAL HIGH (ref 0.3–1.2)
Total Protein: 8.2 g/dL — ABNORMAL HIGH (ref 6.5–8.1)

## 2017-03-14 LAB — CBC WITH DIFFERENTIAL/PLATELET
BASOS PCT: 0 %
Basophils Absolute: 0 10*3/uL (ref 0.0–0.1)
EOS ABS: 0.1 10*3/uL (ref 0.0–0.7)
EOS PCT: 1 %
HCT: 46.3 % (ref 39.0–52.0)
HEMOGLOBIN: 17.8 g/dL — AB (ref 13.0–17.0)
LYMPHS ABS: 1.5 10*3/uL (ref 0.7–4.0)
Lymphocytes Relative: 13 %
MCH: 31.8 pg (ref 26.0–34.0)
MCHC: 38.4 g/dL — AB (ref 30.0–36.0)
MCV: 82.7 fL (ref 78.0–100.0)
MONOS PCT: 7 %
Monocytes Absolute: 0.8 10*3/uL (ref 0.1–1.0)
NEUTROS PCT: 78 %
Neutro Abs: 8.6 10*3/uL — ABNORMAL HIGH (ref 1.7–7.7)
PLATELETS: 197 10*3/uL (ref 150–400)
RBC: 5.6 MIL/uL (ref 4.22–5.81)
RDW: 12.5 % (ref 11.5–15.5)
WBC: 11 10*3/uL — ABNORMAL HIGH (ref 4.0–10.5)

## 2017-03-14 LAB — LIPASE, BLOOD: LIPASE: 23 U/L (ref 11–51)

## 2017-03-14 MED ORDER — SUCRALFATE 1 G PO TABS: 1.0000 g | ORAL_TABLET | Freq: Three times a day (TID) | ORAL | 0 refills | Status: DC

## 2017-03-14 MED ORDER — ONDANSETRON HCL 4 MG/2ML IJ SOLN
4.0000 mg | Freq: Once | INTRAMUSCULAR | Status: AC
  Administered 2017-03-14: 4 mg via INTRAVENOUS
  Filled 2017-03-14: qty 2

## 2017-03-14 MED ORDER — PANTOPRAZOLE SODIUM 20 MG PO TBEC: 20.0000 mg | DELAYED_RELEASE_TABLET | Freq: Every day | ORAL | 0 refills | Status: DC

## 2017-03-14 MED ORDER — SODIUM CHLORIDE 0.9 % IV BOLUS (SEPSIS)
1000.0000 mL | Freq: Once | INTRAVENOUS | Status: AC
  Administered 2017-03-14: 1000 mL via INTRAVENOUS

## 2017-03-14 MED ORDER — GI COCKTAIL ~~LOC~~
30.0000 mL | Freq: Once | ORAL | Status: AC
  Administered 2017-03-14: 30 mL via ORAL
  Filled 2017-03-14: qty 30

## 2017-03-14 NOTE — ED Provider Notes (Signed)
[facility]-EMERGENCY DEPT Provider Note   CSN: [id number] Arrival date & time: 03/14/17  1649     History   Chief Complaint Chief Complaint  Patient presents with  . Emesis    HPI Sean Deleon is a 26 y.o. male.  HPI   Pt is a 26 yo male presenting with vomiting X 5 after eating two chicken sandwiches. Was given zofran but he never filled it. One loose stool today.   Smokes marijuana and ciagretteres.    Past Medical History:  Diagnosis Date  . Hypertension     There are no active problems to display for this patient.   No past surgical history on file.     Home Medications    Prior to Admission medications   Medication Sig Start Date End Date Taking? Authorizing Provider  benzonatate (TESSALON PERLES) 100 MG capsule Take 1 capsule (100 mg total) by mouth 3 (three) times daily as needed for cough (cough). 03/12/17   Hannah Muthersbaugh, PA-C  doxycycline (VIBRAMYCIN) 100 MG capsule Take 1 capsule (100 mg total) by mouth 2 (two) times daily. 08/01/16   Gilda Crease, MD  fluticasone (FLONASE) 50 MCG/ACT nasal spray Place 2 sprays into both nostrils daily. 03/12/17   Hannah Muthersbaugh, PA-C  guaiFENesin (MUCINEX) 600 MG 12 hr tablet Take 2 tablets (1,200 mg total) by mouth 2 (two) times daily. 03/12/17   Hannah Muthersbaugh, PA-C  ibuprofen (ADVIL,MOTRIN) 800 MG tablet Take 1 tablet (800 mg total) by mouth 3 (three) times daily. 08/01/16   Gilda Crease, MD  ondansetron (ZOFRAN ODT) 4 MG disintegrating tablet  ODT q6 hours prn nausea/vomit Patient not taking: Reported on 08/01/2016 01/23/15   Elwin Mocha, MD  oxyCODONE-acetaminophen (PERCOCET) 5-325 MG tablet Take 1 tablet by mouth every 4 (four) hours as needed. 08/01/16   Gilda Crease, MD    Family History No family history on file.  Social History Social History  Substance Use Topics  . Smoking status: Current Every Day Smoker    Packs/day: 0.50  . Smokeless tobacco: Never Used  .  Alcohol use No     Allergies   Penicillins   Review of Systems Review of Systems  Constitutional: Negative for fatigue and fever.  Gastrointestinal: Positive for diarrhea, nausea and vomiting.  Genitourinary: Negative for dysuria.  Neurological: Negative for light-headedness.  All other systems reviewed and are negative.    Physical Exam Updated Vital Signs BP (!) 126/97 (BP Location: Right Arm)   Pulse (!) 106   Temp 98.3 F (36.8 C) (Oral)   Resp 20   Ht  (1.803 m)   Wt 210 lb (95.3 kg)   SpO2 99%   BMI 29.29 kg/m   Physical Exam  Constitutional: He is oriented to person, place, and time. He appears well-nourished.  HENT:  Head: Normocephalic and atraumatic.  Eyes: Conjunctivae are normal. Right eye exhibits no discharge. Left eye exhibits no discharge.  Cardiovascular: Normal rate and regular rhythm.   No murmur heard. Pulmonary/Chest: Effort normal and breath sounds normal.  Abdominal: Soft. Bowel sounds are normal. He exhibits no distension. There is no tenderness.  Neurological: He is oriented to person, place, and time. No cranial nerve deficit.  Skin: Skin is warm and dry. He is not diaphoretic.  Psychiatric: He has a normal mood and affect. His behavior is normal.     ED Treatments / Results  Labs (all labs ordered are listed, but only abnormal results are displayed) Labs Reviewed -  No data to display  EKG  EKG Interpretation None       Radiology No results found.  Procedures Procedures (including critical care time)  Medications Ordered in ED Medications - No data to display   Initial Impression / Assessment and Plan / ED Course  I have reviewed the triage vital signs and the nursing notes.  Pertinent labs & imaging results that were available during my care of the patient were reviewed by me and considered in my medical decision making (see chart for details).      26 year old male presenting with nausea vomiting and  diarrhea. This started recently. Patient reports eating 2 chicken sandwiches and then having it started quickly after. This could be food borne illness versus viral gastroenteritis. We will treat symptomatically with Zofran and fluids. Patient appears well with normal physical exam. Doubt any intra-abdominal pathology such as appendicitis or otherwise.  10:02 PM Patient is feels improved. No vomiting. Patient just feels like he has gastric reflux. We'll treat as such. Taking PO.    Final Clinical Impressions(s) / ED Diagnoses   Final diagnoses:  None    New Prescriptions New Prescriptions   No medications on file     Courteney Randall An, MD 03/14/17 2203

## 2017-03-14 NOTE — ED Triage Notes (Signed)
Pt BIB EMS. Pt c/o vomiting x 3 days. Given Rx for Zofran. Did not fill Rx due to financial concerns. Pt did not vomit per EMS. Pt seen 2 days ago in ED for URI.

## 2017-03-14 NOTE — ED Notes (Signed)
I have given of water to pt, will monitor tolerance. Reports improvement in nausea.

## 2017-03-14 NOTE — Discharge Instructions (Signed)
We think the likely have inflammation of your stomach wall. Please use this medication daily to help decrease her acid. You can also take the medication Carafate to help with her symptoms. Please return if you're unable to stay hydrated or have any concerns pain.

## 2017-03-17 ENCOUNTER — Emergency Department (HOSPITAL_COMMUNITY): Payer: Self-pay

## 2017-03-17 ENCOUNTER — Emergency Department (HOSPITAL_COMMUNITY)
Admission: EM | Admit: 2017-03-17 | Discharge: 2017-03-18 | Disposition: A | Discharge status: Home / Self Care | Payer: Self-pay | Attending: Emergency Medicine | Admitting: Emergency Medicine

## 2017-03-17 ENCOUNTER — Encounter (HOSPITAL_COMMUNITY): Payer: Self-pay | Admitting: Emergency Medicine

## 2017-03-17 DIAGNOSIS — F17200 Nicotine dependence, unspecified, uncomplicated: Secondary | ICD-10-CM | POA: Insufficient documentation

## 2017-03-17 DIAGNOSIS — I10 Essential (primary) hypertension: Secondary | ICD-10-CM | POA: Insufficient documentation

## 2017-03-17 DIAGNOSIS — R1310 Dysphagia, unspecified: Secondary | ICD-10-CM | POA: Insufficient documentation

## 2017-03-17 MED ORDER — GI COCKTAIL ~~LOC~~
30.0000 mL | Freq: Once | ORAL | Status: AC
  Administered 2017-03-17: 30 mL via ORAL
  Filled 2017-03-17: qty 30

## 2017-03-17 MED ORDER — SUCRALFATE 1 GM/10ML PO SUSP: 1.0000 g | Freq: Three times a day (TID) | ORAL | 0 refills | Status: DC

## 2017-03-17 MED ORDER — RANITIDINE HCL 15 MG/ML PO SYRP: 150.0000 mg | ORAL_SOLUTION | Freq: Two times a day (BID) | ORAL | 1 refills | Status: DC

## 2017-03-17 NOTE — ED Triage Notes (Signed)
Patient is from home and transported via [facility]. Patient reports he is unable to have food pass down his esophogus but reports able to keep water down. This has been going on for 3 days and been seen at [facility] for this already. Pt is ambulatory and in no acute respiratory distress. Pt is able to swallow salvia.

## 2017-03-17 NOTE — Discharge Instructions (Signed)
Your x-ray did not show any abnormalities with your airway or in your throat. Takes Zantac as prescribed for symptoms. If symptoms persist, we recommend follow-up with a gastroenterologist for an endoscopy. Be sure to take small bites of food and to chew your food well to prevent food from getting lodged in your throat.

## 2017-03-17 NOTE — ED Provider Notes (Signed)
[facility]-EMERGENCY DEPT Provider Note   CSN: [id number] Arrival date & time: 03/17/17  1835    History   Chief Complaint Chief Complaint  Patient presents with  . Throat Issue    HPI Sean Deleon is a 26 y.o. male.  26 year old male with a history of hypertension presents to the emergency department for evaluation of dysphasia and foreign body sensation in his throat. He states that he feels as though foods get "stuck" when swallowing at the level of his neck. Patient reports trying to push on his neck to get the food to go down. He has been able to tolerate foods as well as liquids without vomiting. He is tolerating secretions. Symptoms preceded by vomiting and diarrhea 2 days ago. He has noted associated belching which has increased compared to his baseline. He has continued to take Mucinex for alleged bronchitis. He denies inability to swallow, fevers, sore throat, or difficulty breathing.   The history is provided by the patient. No language interpreter was used.    Past Medical History:  Diagnosis Date  . Hypertension     There are no active problems to display for this patient.   History reviewed. No pertinent surgical history.    Home Medications    Prior to Admission medications   Medication Sig Start Date End Date Taking? Authorizing Provider  albuterol (PROVENTIL HFA;VENTOLIN HFA) 108 (90 Base) MCG/ACT inhaler Inhale 1-2 puffs into the lungs every 6 (six) hours as needed for wheezing or shortness of breath.   Yes Historical Provider, MD  esomeprazole (NEXIUM) 20 MG capsule Take 20 mg by mouth daily at 12 noon.   Yes Historical Provider, MD  guaiFENesin (MUCINEX CHEST CONGESTION CHILD) 100 MG/5ML liquid Take 400 mg by mouth 3 (three) times daily as needed for cough.   Yes Historical Provider, MD  oxymetazoline (AFRIN) 0.05 % nasal spray Place 1 spray into both nostrils 2 (two) times daily as needed for congestion.   Yes Historical Provider, MD  benzonatate  (TESSALON PERLES) 100 MG capsule Take 1 capsule (100 mg total) by mouth 3 (three) times daily as needed for cough (cough). Patient not taking: Reported on 03/17/2017 03/12/17   Dahlia Client Muthersbaugh, PA-C  fluticasone (FLONASE) 50 MCG/ACT nasal spray Place 2 sprays into both nostrils daily. Patient not taking: Reported on 03/17/2017 03/12/17   Dahlia Client Muthersbaugh, PA-C  guaiFENesin (MUCINEX) 600 MG 12 hr tablet Take 2 tablets (1,200 mg total) by mouth 2 (two) times daily. Patient not taking: Reported on 03/17/2017 03/12/17   Dahlia Client Muthersbaugh, PA-C  ranitidine (ZANTAC) 15 MG/ML syrup Take 10 mLs (150 mg total) by mouth 2 (two) times daily. 03/17/17   Antony Madura, PA-C    Family History No family history on file.  Social History Social History  Substance Use Topics  . Smoking status: Current Every Day Smoker    Packs/day: 0.50  . Smokeless tobacco: Never Used  . Alcohol use No     Allergies   Penicillins   Review of Systems Review of Systems Ten systems reviewed and are negative for acute change, except as noted in the HPI.    Physical Exam Updated Vital Signs BP (!) 144/81   Pulse 71   Temp 98.9 F (37.2 C) (Oral)   Resp 18   Ht  (1.803 m)   Wt 93 kg   SpO2 100%   BMI 28.61 kg/m   Physical Exam  Constitutional: He is oriented to person, place, and time. He appears well-developed and  well-nourished. No distress.  Calm and nontoxic appearing.  HENT:  Head: Normocephalic and atraumatic.  Mouth/Throat: Oropharynx is clear and moist.  Oropharynx clear. Uvula midline. No angioedema. Patient tolerating secretions without difficulty. No tripoding. Epiglottis is visible.  Eyes: Conjunctivae and EOM are normal. No scleral icterus.  Neck: Normal range of motion.  No stridor. No meningismus.  Cardiovascular: Normal rate, regular rhythm and intact distal pulses.   Pulmonary/Chest: Effort normal. No respiratory distress. He has no wheezes. He has no rales.  Respirations even and  unlabored. Lungs clear to auscultation bilaterally.  Musculoskeletal: Normal range of motion.  Neurological: He is alert and oriented to person, place, and time. He exhibits normal muscle tone. Coordination normal.  Skin: Skin is warm and dry. No rash noted. He is not diaphoretic. No erythema. No pallor.  Psychiatric: He has a normal mood and affect. His behavior is normal.  Nursing note and vitals reviewed.    ED Treatments / Results  Labs (all labs ordered are listed, but only abnormal results are displayed) Labs Reviewed - No data to display  EKG  EKG Interpretation None       Radiology Dg Neck Soft Tissue  Result Date: 03/17/2017 CLINICAL DATA:  Difficulty swallowing EXAM: NECK SOFT TISSUES - 1+ VIEW COMPARISON:  None. FINDINGS: There is no evidence of retropharyngeal soft tissue swelling or epiglottic enlargement. The cervical airway is unremarkable and no radio-opaque foreign body identified. IMPRESSION: No cervical soft tissue swelling.  No radiopaque foreign body. Electronically Signed   By: Deatra Robinson M.D.   On: 03/17/2017 22:49    Procedures Procedures (including critical care time)  Medications Ordered in ED Medications  gi cocktail (Maalox,Lidocaine,Donnatal) (30 mLs Oral Given 03/17/17 2232)     Initial Impression / Assessment and Plan / ED Course  I have reviewed the triage vital signs and the nursing notes.  Pertinent labs & imaging results that were available during my care of the patient were reviewed by me and considered in my medical decision making (see chart for details).     26 year old male presents to the emergency department for complaints of dysphagia. He has been able to tolerate food and fluids. He is tolerating secretions at bedside as well as water. No tripoding or stridor noted. No angioedema. X-ray of the neck obtained which shows no evidence of radiopaque foreign body. Cervical airway appears clear.  Given reassuring workup, I believe  the patient is stable for further evaluation on an outpatient basis. Will refer to gastroenterology should the patient require endoscopy. Patient placed on Zantac liquid. Return precautions discussed and provided. Patient discharged in stable condition with no unaddressed concerns.   Final Clinical Impressions(s) / ED Diagnoses   Final diagnoses:  Dysphagia, unspecified type    New Prescriptions Discharge Medication List as of 03/17/2017 11:52 PM       Antony Madura, PA-C 03/18/17 [id number]    Linwood Dibbles, MD 03/18/17 2053

## 2017-03-17 NOTE — ED Triage Notes (Signed)
Patient states that when he eats he feels that he has sensation that food gets lodged in esophagus after eating that has been going on since when he was seen here for URI 4-5 days ago.

## 2017-03-18 ENCOUNTER — Encounter (HOSPITAL_COMMUNITY): Payer: Self-pay

## 2017-03-18 ENCOUNTER — Emergency Department (HOSPITAL_COMMUNITY)
Admission: EM | Admit: 2017-03-18 | Discharge: 2017-03-18 | Disposition: A | Discharge status: Home / Self Care | Payer: Self-pay | Attending: Emergency Medicine | Admitting: Emergency Medicine

## 2017-03-18 DIAGNOSIS — I10 Essential (primary) hypertension: Secondary | ICD-10-CM | POA: Insufficient documentation

## 2017-03-18 DIAGNOSIS — R0989 Other specified symptoms and signs involving the circulatory and respiratory systems: Secondary | ICD-10-CM | POA: Insufficient documentation

## 2017-03-18 DIAGNOSIS — F17200 Nicotine dependence, unspecified, uncomplicated: Secondary | ICD-10-CM | POA: Insufficient documentation

## 2017-03-18 DIAGNOSIS — K2100 Gastro-esophageal reflux disease with esophagitis, without bleeding: Secondary | ICD-10-CM

## 2017-03-18 DIAGNOSIS — K219 Gastro-esophageal reflux disease without esophagitis: Secondary | ICD-10-CM | POA: Insufficient documentation

## 2017-03-18 DIAGNOSIS — R198 Other specified symptoms and signs involving the digestive system and abdomen: Secondary | ICD-10-CM

## 2017-03-18 DIAGNOSIS — Z79899 Other long term (current) drug therapy: Secondary | ICD-10-CM | POA: Insufficient documentation

## 2017-03-18 MED ORDER — GI COCKTAIL ~~LOC~~
30.0000 mL | Freq: Once | ORAL | Status: AC
  Administered 2017-03-18: 30 mL via ORAL
  Filled 2017-03-18: qty 30

## 2017-03-18 NOTE — ED Provider Notes (Signed)
[facility] Provider Note   CSN: [id number] Arrival date & time: 03/18/17  1814  By signing my name below, I, Sean Deleon, attest that this documentation has been prepared under the direction and in the presence of Raeford Razor, MD. Electronically Signed: Rosario Deleon, ED Scribe. 03/18/17. 7:32 PM.  History   Chief Complaint Chief Complaint  Patient presents with  . Dysphagia   The history is provided by the patient and medical records. No language interpreter was used.    HPI Comments: Sean Deleon is a 26 y.o. male BIB EMS, with a PMHx of HTN, who presents to the Emergency Department complaining of persistent, pressure-like bulbous sensation of foreign body to the throat which began several days ago. He notes associated dysphagia and nausea secondary to this sensation. Per pt, he was seen several days ago and was dx'd w/ likely viral etiology at that time. His symptoms were improving; however, several days ago he developed this sensation to his throat which is worse with PO intake. He has been able to tolerate PO without any episodes of vomiting. He also states that he has had increased eructation since the onset of his symptoms. Pt was seen at [facility] yesterday for this issue, with workup including XR of the neck which was unremarkable. He was treated with a GI cocktail which improved his symptoms and discharged with Zantac. This sensation returned this morning and he took Zantac today without significant relief. He denies shortness of breath, chest pain, or any other associated symptoms.   Past Medical History:  Diagnosis Date  . Hypertension    There are no active problems to display for this patient.  History reviewed. No pertinent surgical history.  Home Medications    Prior to Admission medications   Medication Sig Start Date End Date Taking? Authorizing Provider  albuterol (PROVENTIL HFA;VENTOLIN HFA) 108 (90 Base) MCG/ACT inhaler Inhale 1-2 puffs  into the lungs every 6 (six) hours as needed for wheezing or shortness of breath.    Historical Provider, MD  benzonatate (TESSALON PERLES) 100 MG capsule Take 1 capsule (100 mg total) by mouth 3 (three) times daily as needed for cough (cough). Patient not taking: Reported on 03/17/2017 03/12/17   Dahlia Client Muthersbaugh, PA-C  esomeprazole (NEXIUM) 20 MG capsule Take 20 mg by mouth daily at 12 noon.    Historical Provider, MD  fluticasone (FLONASE) 50 MCG/ACT nasal spray Place 2 sprays into both nostrils daily. Patient not taking: Reported on 03/17/2017 03/12/17   Dahlia Client Muthersbaugh, PA-C  guaiFENesin [facility] CHEST CONGESTION CHILD) 100 MG/5ML liquid Take 400 mg by mouth 3 (three) times daily as needed for cough.    Historical Provider, MD  guaiFENesin (MUCINEX) 600 MG 12 hr tablet Take 2 tablets (1,200 mg total) by mouth 2 (two) times daily. Patient not taking: Reported on 03/17/2017 03/12/17   Dahlia Client Muthersbaugh, PA-C  oxymetazoline (AFRIN) 0.05 % nasal spray Place 1 spray into both nostrils 2 (two) times daily as needed for congestion.    Historical Provider, MD  ranitidine (ZANTAC) 15 MG/ML syrup Take 10 mLs (150 mg total) by mouth 2 (two) times daily. 03/17/17   Antony Madura, PA-C   Family History No family history on file.  Social History Social History  Substance Use Topics  . Smoking status: Current Every Day Smoker    Packs/day: 0.50  . Smokeless tobacco: Never Used  . Alcohol use No   Allergies   Penicillins  Review of Systems Review of Systems  HENT: Positive for trouble swallowing.        Positive for sensation of foreign body to the throat.   Respiratory: Negative for shortness of breath.   Cardiovascular: Negative for chest pain.  Gastrointestinal: Positive for nausea. Negative for vomiting.  All other systems reviewed and are negative.  Physical Exam Updated Vital Signs BP (!) 120/98 (BP Location: Left Arm)   Pulse 85   Temp 98.3 F (36.8 C) (Oral)   Resp 18   SpO2 99%     Physical Exam  Constitutional: He appears well-developed and well-nourished.  HENT:  Head: Normocephalic.  Right Ear: External ear normal.  Left Ear: External ear normal.  Nose: Nose normal.  Eyes: Conjunctivae are normal. Right eye exhibits no discharge. Left eye exhibits no discharge.  Neck: Normal range of motion.  Cardiovascular: Normal rate, regular rhythm and normal heart sounds.   No murmur heard. Pulmonary/Chest: Effort normal and breath sounds normal. No respiratory distress. He has no wheezes. He has no rales.  Abdominal: Soft. There is no tenderness. There is no rebound and no guarding.  Musculoskeletal: Normal range of motion. He exhibits no edema or tenderness.  Neurological: He is alert. No cranial nerve deficit. Coordination normal.  Skin: Skin is warm and dry. No rash noted. No erythema. No pallor.  Psychiatric: He has a normal mood and affect. His behavior is normal.  Nursing note and vitals reviewed.  ED Treatments / Results  DIAGNOSTIC STUDIES: Oxygen Saturation is 99% on RA, normal by my interpretation.   COORDINATION OF CARE: 7:34 PM-Discussed next steps with pt. Pt verbalized understanding and is agreeable with the plan.   Labs (all labs ordered are listed, but only abnormal results are displayed) Labs Reviewed - No data to display  EKG  EKG Interpretation None      Radiology Dg Neck Soft Tissue  Result Date: 03/17/2017 CLINICAL DATA:  Difficulty swallowing EXAM: NECK SOFT TISSUES - 1+ VIEW COMPARISON:  None. FINDINGS: There is no evidence of retropharyngeal soft tissue swelling or epiglottic enlargement. The cervical airway is unremarkable and no radio-opaque foreign body identified. IMPRESSION: No cervical soft tissue swelling.  No radiopaque foreign body. Electronically Signed   By: Deatra Robinson M.D.   On: 03/17/2017 22:49   Procedures Procedures   Medications Ordered in ED Medications - No data to display  Initial Impression / Assessment  and Plan / ED Course  I have reviewed the triage vital signs and the nursing notes.  Pertinent labs & imaging results that were available during my care of the patient were reviewed by me and considered in my medical decision making (see chart for details).     Final Clinical Impressions(s) / ED Diagnoses   Final diagnoses:  Globus sensation  Gastroesophageal reflux disease with esophagitis   New Prescriptions New Prescriptions   No medications on file   I personally preformed the services scribed in my presence. The recorded information has been reviewed is accurate. Raeford Razor, MD.     Raeford Razor, MD 03/26/17 [phone]

## 2017-03-18 NOTE — ED Notes (Addendum)
Pt complain of having cheat pain early but not at this moment. However pt has refused to have EKG done

## 2017-03-18 NOTE — ED Triage Notes (Signed)
Pt reports going to [place] yesterday because food was getting stuck in his throat.  They diagnosed him with acid reflux and told him to slowly introduce solids back into his diet.  He ate a hot dog today and felt like it was stuck.  He threw it back up and feels better but is scared he rushed back into it to fast.  Would like to be seen to make sure nothing is in his throat.  Nothing obvious in his throat, speaking in complete sentences, no apparent distress in triage

## 2017-03-18 NOTE — ED Notes (Signed)
Pt stable, understands discharge instructions, and reasons for return.   

## 2017-03-27 ENCOUNTER — Emergency Department (HOSPITAL_COMMUNITY)
Admission: EM | Admit: 2017-03-27 | Discharge: 2017-03-28 | Disposition: A | Discharge status: Home / Self Care | Payer: Self-pay | Attending: Emergency Medicine | Admitting: Emergency Medicine

## 2017-03-27 ENCOUNTER — Encounter (HOSPITAL_COMMUNITY): Payer: Self-pay

## 2017-03-27 DIAGNOSIS — K2900 Acute gastritis without bleeding: Secondary | ICD-10-CM

## 2017-03-27 DIAGNOSIS — I10 Essential (primary) hypertension: Secondary | ICD-10-CM | POA: Insufficient documentation

## 2017-03-27 DIAGNOSIS — F17200 Nicotine dependence, unspecified, uncomplicated: Secondary | ICD-10-CM | POA: Insufficient documentation

## 2017-03-27 DIAGNOSIS — K2960 Other gastritis without bleeding: Secondary | ICD-10-CM | POA: Insufficient documentation

## 2017-03-27 DIAGNOSIS — Z79899 Other long term (current) drug therapy: Secondary | ICD-10-CM | POA: Insufficient documentation

## 2017-03-27 LAB — CBC WITH DIFFERENTIAL/PLATELET
BASOS PCT: 0 %
Basophils Absolute: 0 10*3/uL (ref 0.0–0.1)
EOS ABS: 0.2 10*3/uL (ref 0.0–0.7)
Eosinophils Relative: 3 %
HCT: 38.4 % — ABNORMAL LOW (ref 39.0–52.0)
HEMOGLOBIN: 14.2 g/dL (ref 13.0–17.0)
LYMPHS PCT: 25 %
Lymphs Abs: 2 10*3/uL (ref 0.7–4.0)
MCH: 30.6 pg (ref 26.0–34.0)
MCHC: 37 g/dL — ABNORMAL HIGH (ref 30.0–36.0)
MCV: 82.8 fL (ref 78.0–100.0)
MONOS PCT: 7 %
Monocytes Absolute: 0.6 10*3/uL (ref 0.1–1.0)
NEUTROS PCT: 65 %
Neutro Abs: 5.1 10*3/uL (ref 1.7–7.7)
Platelets: 213 10*3/uL (ref 150–400)
RBC: 4.64 MIL/uL (ref 4.22–5.81)
RDW: 13.3 % (ref 11.5–15.5)
WBC: 7.9 10*3/uL (ref 4.0–10.5)

## 2017-03-27 LAB — COMPREHENSIVE METABOLIC PANEL
ALBUMIN: 4.9 g/dL (ref 3.5–5.0)
ALK PHOS: 56 U/L (ref 38–126)
ALT: 16 U/L — ABNORMAL LOW (ref 17–63)
AST: 21 U/L (ref 15–41)
Anion gap: 9 (ref 5–15)
BILIRUBIN TOTAL: 0.9 mg/dL (ref 0.3–1.2)
BUN: 7 mg/dL (ref 6–20)
CO2: 28 mmol/L (ref 22–32)
Calcium: 9.6 mg/dL (ref 8.9–10.3)
Chloride: 100 mmol/L — ABNORMAL LOW (ref 101–111)
Creatinine, Ser: 0.85 mg/dL (ref 0.61–1.24)
GFR calc Af Amer: >60 mL/min (ref >60)
GFR calc non Af Amer: >60 mL/min (ref >60)
Glucose, Bld: 88 mg/dL (ref 65–99)
POTASSIUM: 3.3 mmol/L — AB (ref 3.5–5.1)
Sodium: 137 mmol/L (ref 135–145)
TOTAL PROTEIN: 7.4 g/dL (ref 6.5–8.1)

## 2017-03-27 LAB — LIPASE, BLOOD: Lipase: 27 U/L (ref 11–51)

## 2017-03-27 MED ORDER — GI COCKTAIL ~~LOC~~: 30.0000 mL | Freq: Once | ORAL | Status: DC

## 2017-03-27 MED ORDER — FAMOTIDINE 20 MG PO TABS
20.0000 mg | ORAL_TABLET | Freq: Once | ORAL | Status: AC
  Administered 2017-03-27: 20 mg via ORAL
  Filled 2017-03-27 (×2): qty 1

## 2017-03-27 MED ORDER — ONDANSETRON HCL 4 MG/2ML IJ SOLN: 4.0000 mg | Freq: Once | INTRAMUSCULAR | Status: DC

## 2017-03-27 MED ORDER — SODIUM CHLORIDE 0.9 % IV BOLUS (SEPSIS): 1000.0000 mL | Freq: Once | INTRAVENOUS | Status: DC

## 2017-03-27 MED ORDER — FAMOTIDINE IN NACL 20-0.9 MG/50ML-% IV SOLN: 20.0000 mg | Freq: Once | INTRAVENOUS | Status: DC

## 2017-03-27 MED ORDER — PANTOPRAZOLE SODIUM 40 MG IV SOLR: 40.0000 mg | Freq: Once | INTRAVENOUS | Status: DC

## 2017-03-27 MED ORDER — GI COCKTAIL ~~LOC~~
30.0000 mL | Freq: Once | ORAL | Status: AC
Start: 2017-03-27 — End: 2017-03-27
  Administered 2017-03-27: 30 mL via ORAL
  Filled 2017-03-27: qty 30

## 2017-03-27 MED ORDER — RANITIDINE HCL 150 MG/10ML PO SYRP
150.0000 mg | ORAL_SOLUTION | Freq: Once | ORAL | Status: AC
  Administered 2017-03-28: 150 mg via ORAL
  Filled 2017-03-27: qty 10

## 2017-03-27 NOTE — ED Triage Notes (Signed)
Pt states he ate spicy chicken yesterday.  After going to bed, he started having reflux symptoms.

## 2017-03-27 NOTE — ED Notes (Signed)
Requested urine from patient. 

## 2017-03-27 NOTE — ED Notes (Signed)
Called for labs, no answer

## 2017-03-27 NOTE — ED Triage Notes (Signed)
Per EMS, pt from home.  Pt c/o burping and GERD symptoms.  Nausea with no vomiting.  Pt is able to eat and drink.  Pt dx and given script for zantac.  Pt did not get script filled.  Pt is to see Laurette Schimke and he has not done that.  Vitals: 128/84, hr 88, resp 18, 98%

## 2017-03-27 NOTE — ED Provider Notes (Signed)
[facility]-EMERGENC[facility] DEPT Provider Note   CSN: [id number] Arrival date & time: 03/27/17  1746     History   Chief Complaint Chief Complaint  Patient presents with  . Gastroesophageal Reflux    HPI Sean Deleon is a 26 y.o. male hx of HTN, Here presenting with nausea vomiting. Patient was seen twice in the last week or so for epigastric pain and nausea and vomiting. Was diagnosed with reflux disease. Patient actually was sent home with Zantac and has been taking it and didn't help the symptoms. He has been eating more solids for the last 3 days but ran out of Zantac. He decided to have some spicy chicken last night and had some vomiting this morning and worsening epigastric pain. Patient has not followed up with gastroenterology and does not have insurance currently.     The history is provided by the patient.    Past Medical History:  Diagnosis Date  . Hypertension     There are no active problems to display for this patient.   History reviewed. No pertinent surgical history.     Home Medications    Prior to Admission medications   Medication Sig Start Date End Date Taking? Authorizing Provider  albuterol (PROVENTIL HFA;VENTOLIN HFA) 108 (90 Base) MCG/ACT inhaler Inhale 1-2 puffs into the lungs every 6 (six) hours as needed for wheezing or shortness of breath.   Yes Historical Provider, MD  esomeprazole (NEXIUM) 20 MG capsule Take 20 mg by mouth daily at 12 noon.   Yes Historical Provider, MD  oxymetazoline (AFRIN) 0.05 % nasal spray Place 1 spray into both nostrils 2 (two) times daily as needed for congestion.   Yes Historical Provider, MD  benzonatate (TESSALON PERLES) 100 MG capsule Take 1 capsule (100 mg total) by mouth 3 (three) times daily as needed for cough (cough). Patient not taking: Reported on 03/17/2017 03/12/17   Dahlia Client Muthersbaugh, PA-C  fluticasone (FLONASE) 50 MCG/ACT nasal spray Place 2 sprays into both nostrils daily. Patient not taking: Reported on  03/17/2017 03/12/17   Dahlia Client Muthersbaugh, PA-C  guaiFENesin (MUCINEX) 600 MG 12 hr tablet Take 2 tablets (1,200 mg total) by mouth 2 (two) times daily. Patient not taking: Reported on 03/17/2017 03/12/17   Dahlia Client Muthersbaugh, PA-C  ranitidine (ZANTAC) 15 MG/ML syrup Take 10 mLs (150 mg total) by mouth 2 (two) times daily. Patient not taking: Reported on 03/27/2017 03/17/17   Antony Madura, PA-C    Family History History reviewed. No pertinent family history.  Social History Social History  Substance Use Topics  . Smoking status: Current Every Day Smoker    Packs/day: 0.50  . Smokeless tobacco: Never Used  . Alcohol use No     Allergies   Penicillins   Review of Systems Review of Systems  Gastrointestinal: Positive for abdominal pain and vomiting.  All other systems reviewed and are negative.    Physical Exam Updated Vital Signs BP 122/78 (BP Location: Left Arm)   Pulse 67   Temp 98.6 F (37 C) (Oral)   Resp 19   Ht  (1.803 m)   Wt 203 lb (92.1 kg)   SpO2 100%   BMI 28.31 kg/m   Physical Exam  Constitutional: He is oriented to person, place, and time. He appears well-developed and well-nourished.  HENT:  Head: Normocephalic.  MM slightly dry   Eyes: EOM are normal. Pupils are equal, round, and reactive to light.  Neck: Normal range of motion. Neck supple.  Cardiovascular: Normal rate,  regular rhythm and normal heart sounds.   Pulmonary/Chest: Effort normal and breath sounds normal. No respiratory distress. He has no wheezes.  Abdominal: Soft. Bowel sounds are normal. He exhibits no distension. There is no tenderness. There is no guarding.  Musculoskeletal: Normal range of motion.  Neurological: He is alert and oriented to person, place, and time. No cranial nerve deficit. Coordination normal.  Skin: Skin is warm.  Psychiatric: He has a normal mood and affect.  Nursing note and vitals reviewed.    ED Treatments / Results  Labs (all labs ordered are listed,  but only abnormal results are displayed) Labs Reviewed  CBC WITH DIFFERENTIAL/PLATELET - Abnormal; Notable for the following:       Result Value   HCT 38.4 (*)    MCHC 37.0 (*)    All other components within normal limits  COMPREHENSIVE METABOLIC PANEL - Abnormal; Notable for the following:    Potassium 3.3 (*)    Chloride 100 (*)    ALT 16 (*)    All other components within normal limits  LIPASE, BLOOD  URINALYSIS, ROUTINE W REFLEX MICROSCOPIC    EKG  EKG Interpretation None       Radiology No results found.  Procedures Procedures (including critical care time)  Medications Ordered in ED Medications  ranitidine (ZANTAC) 150 MG/10ML syrup 150 mg (not administered)  gi cocktail (Maalox,Lidocaine,Donnatal) (30 mLs Oral Given 03/27/17 2214)  famotidine (PEPCID) tablet 20 mg (20 mg Oral Given 03/27/17 2332)     Initial Impression / Assessment and Plan / ED Course  I have reviewed the triage vital signs and the nursing notes.  Pertinent labs & imaging results that were available during my care of the patient were reviewed by me and considered in my medical decision making (see chart for details).    Sean Deleon is a 26 y.o. male here with abdominal pain, vomiting. Had spicy chicken yesterday. Likely reflux vs stomach ulcer. Will give zantac and GI cocktail. Will repeat labs. Abdomen nontender currently.   11:57 PM Labs unremarkable. Tolerated PO after zantac, pepcid. Will dc home with same. Will have him follow up with GI.   Final Clinical Impressions(s) / ED Diagnoses   Final diagnoses:  None    New Prescriptions New Prescriptions   No medications on file     Charlynne Pander, MD 03/28/17 0000

## 2017-03-27 NOTE — ED Notes (Signed)
Unable to collect all labs, could not obtain enough blood

## 2017-03-28 LAB — URINALYSIS, ROUTINE W REFLEX MICROSCOPIC
BILIRUBIN URINE: NEGATIVE
GLUCOSE, UA: NEGATIVE mg/dL
KETONES UR: 20 mg/dL — AB
Leukocytes, UA: NEGATIVE
Nitrite: NEGATIVE
PROTEIN: NEGATIVE mg/dL
Specific Gravity, Urine: 1.012 (ref 1.005–1.030)
pH: 5 (ref 5.0–8.0)

## 2017-03-28 LAB — URINALYSIS, MICROSCOPIC (REFLEX)
Bacteria, UA: NONE SEEN
RBC / HPF: NONE SEEN RBC/hpf (ref 0–5)

## 2017-03-28 MED ORDER — ESOMEPRAZOLE MAGNESIUM 20 MG PO CPDR: 20.0000 mg | DELAYED_RELEASE_CAPSULE | Freq: Every day | ORAL | 0 refills | Status: DC

## 2017-03-28 MED ORDER — RANITIDINE HCL 15 MG/ML PO SYRP: 150.0000 mg | ORAL_SOLUTION | Freq: Two times a day (BID) | ORAL | 1 refills | Status: DC

## 2017-03-28 NOTE — Discharge Instructions (Signed)
Take zantac liquid twice daily.   Take nexium daily as well.   See your doctor. Consider seeing a GI doctor (see above).   Avoid spicy foods.   Return to ER if you have severe abdominal pain, vomiting, fevers.

## 2017-09-12 IMAGING — CR DG CHEST 2V
2 series · 2 of 2 positions shown · non-contrast
Comparison: 12/06/2015

CLINICAL DATA: Productive cough for 3 days.

EXAM:
CHEST  2 VIEW

[w chest pa]
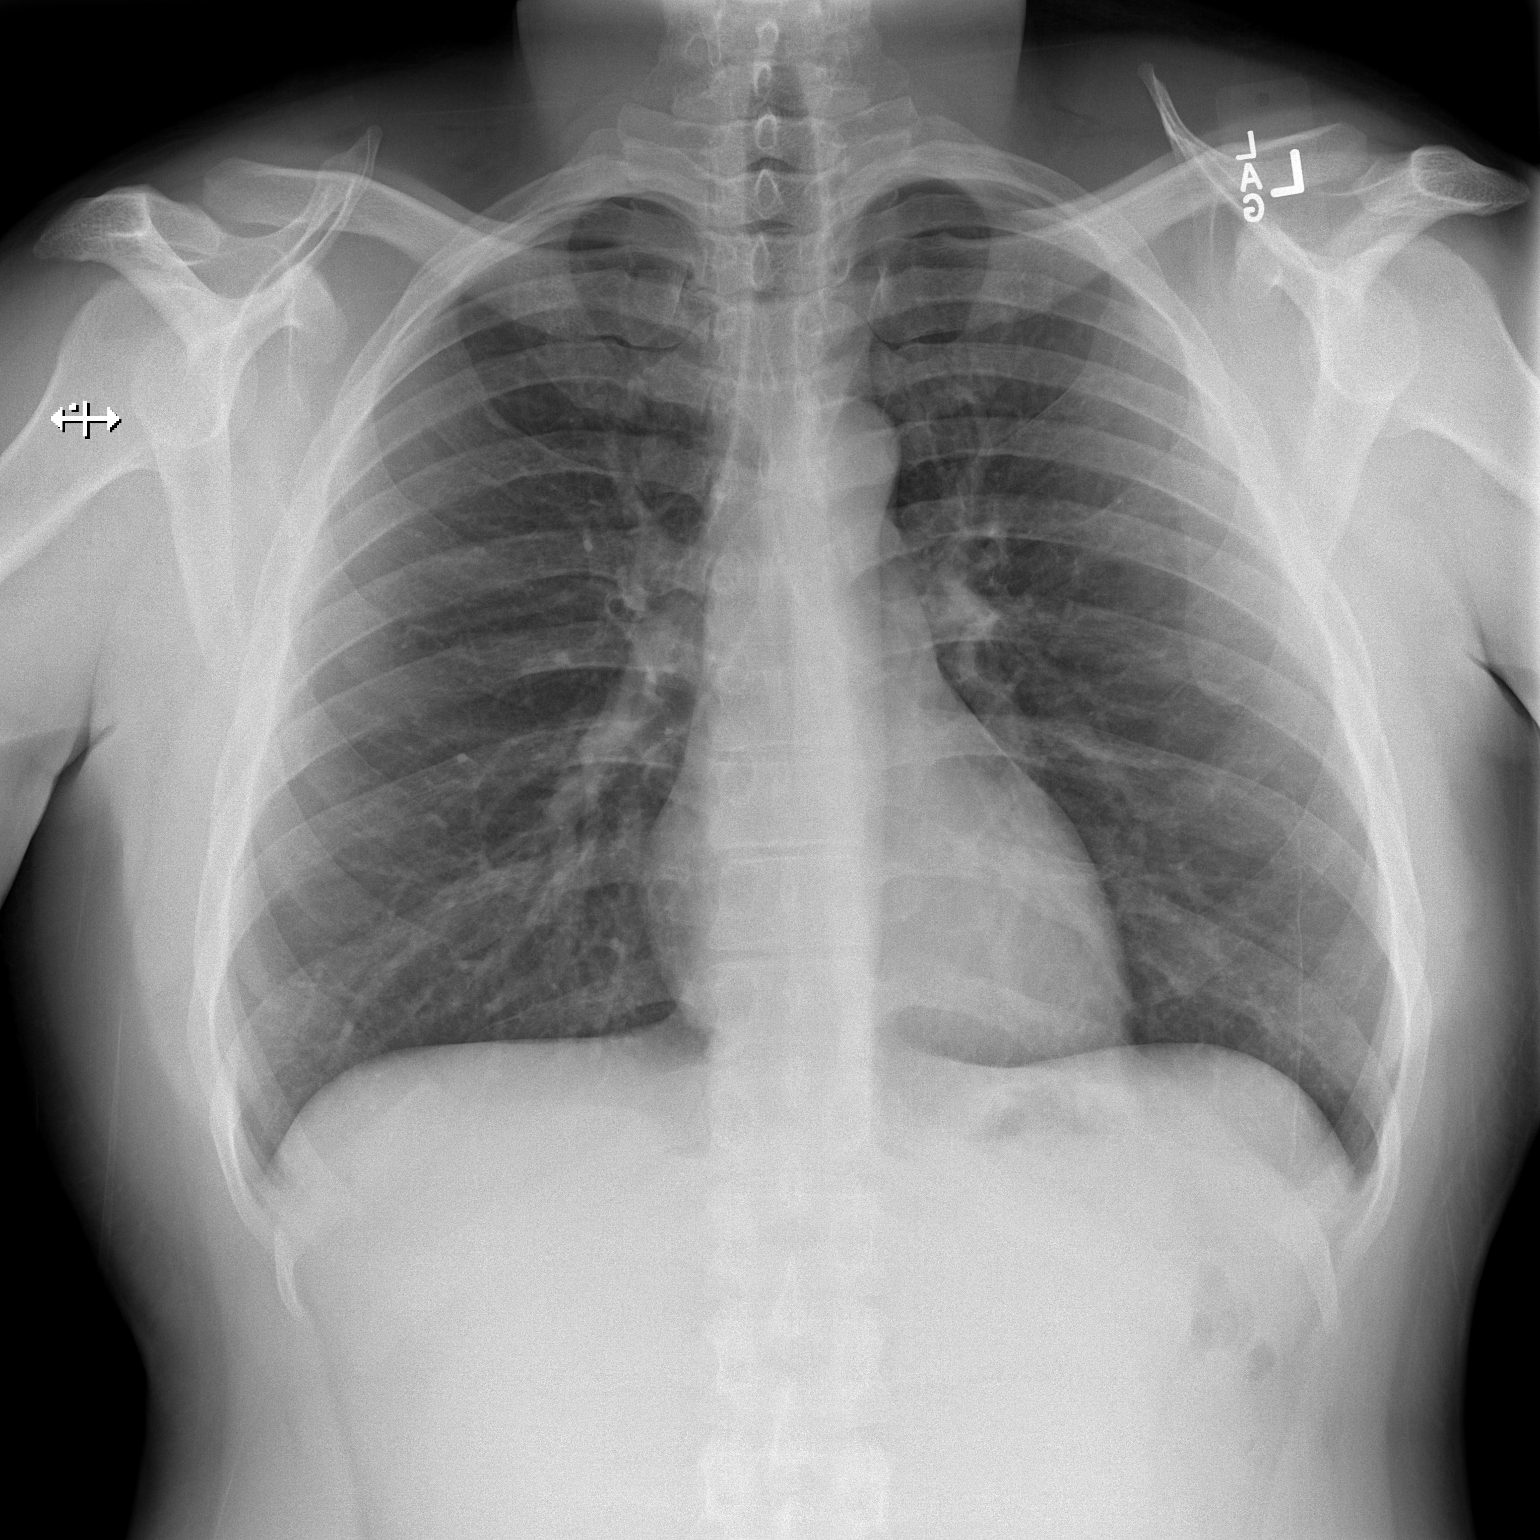

[w chest lat]
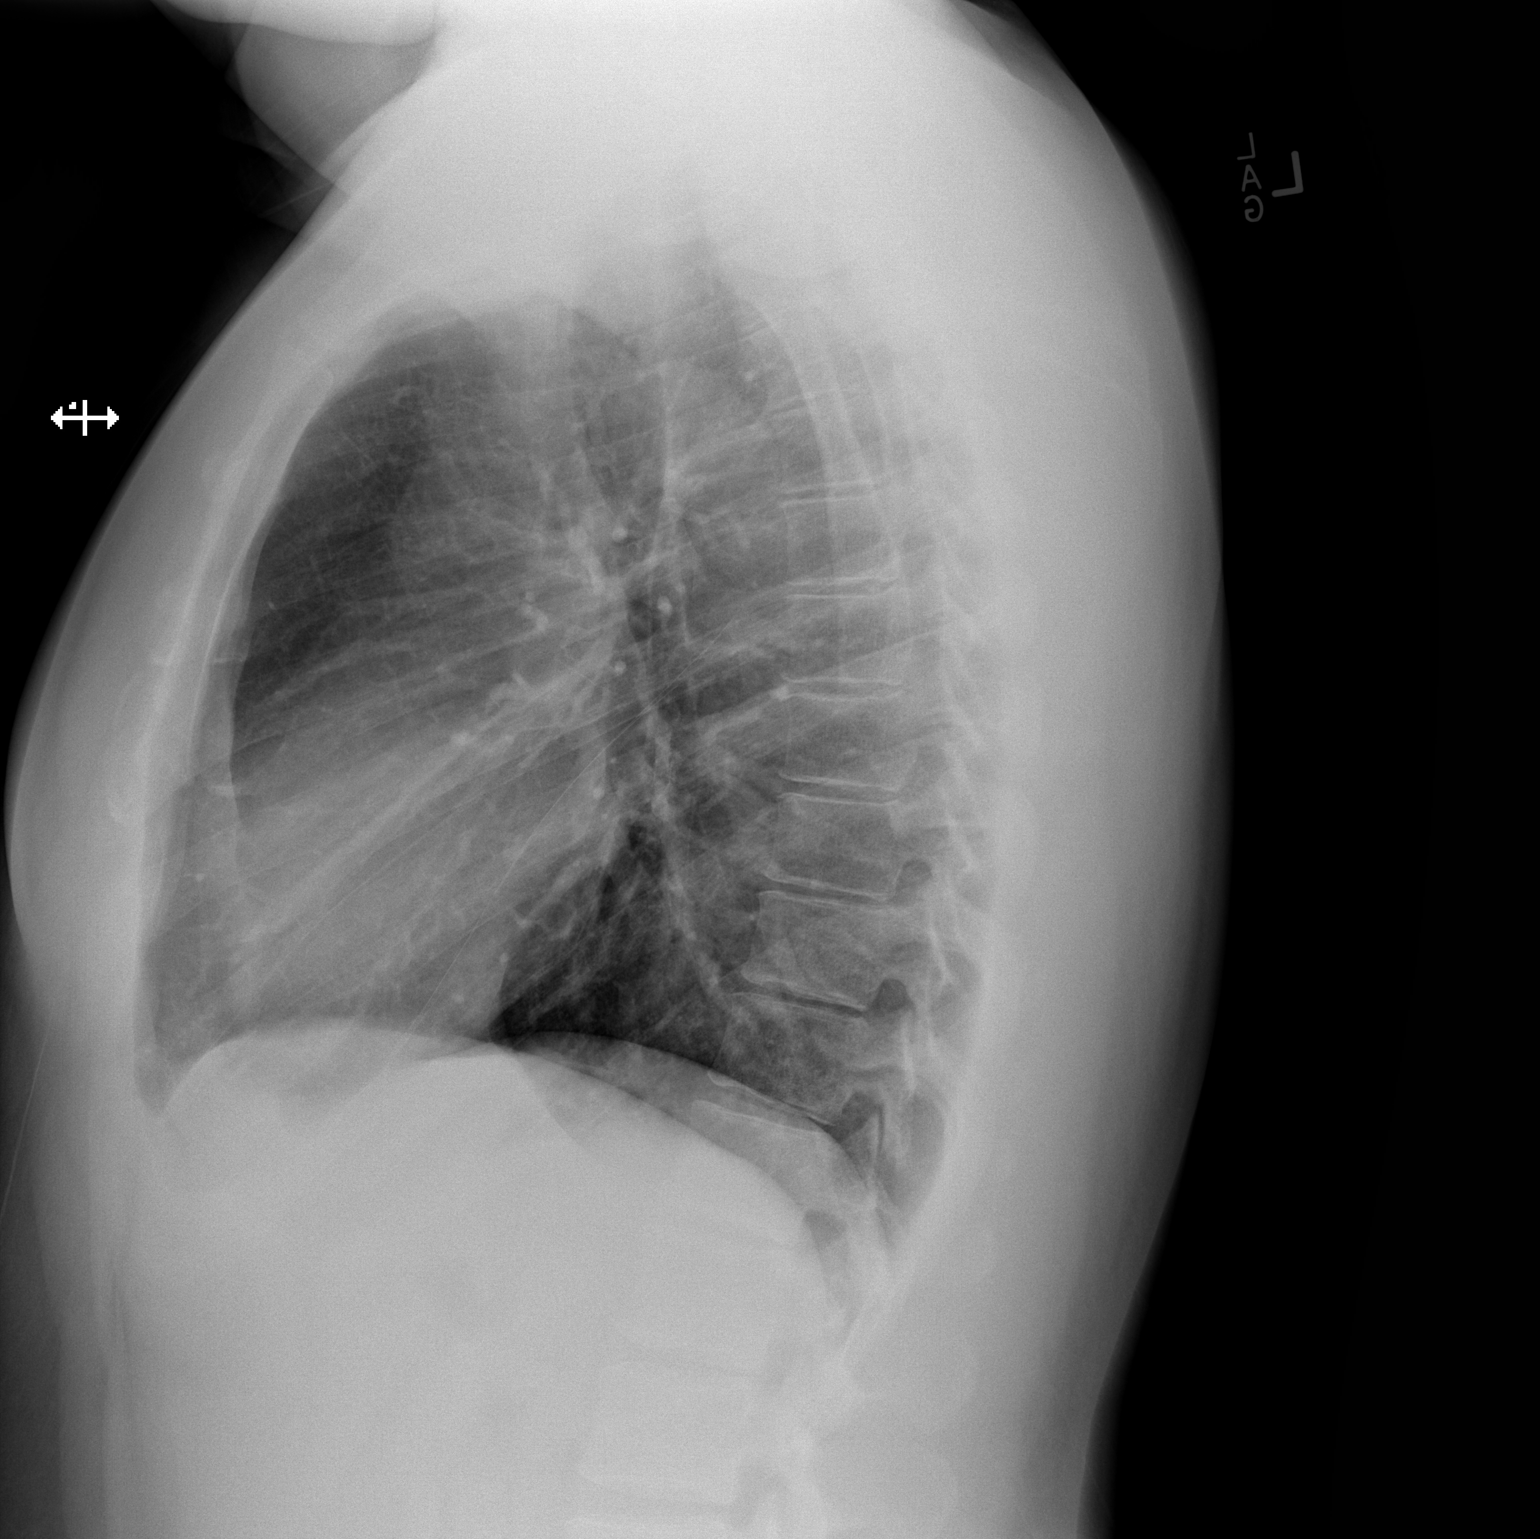

[2 of 2 positions shown; findings below may reference images not displayed]

FINDINGS: The heart size and mediastinal contours are within normal limits.
Both lungs are clear. The visualized skeletal structures are
unremarkable.
IMPRESSION: No active cardiopulmonary disease.

## 2019-07-07 ENCOUNTER — Ambulatory Visit (HOSPITAL_COMMUNITY)
Admission: EM | Admit: 2019-07-07 | Discharge: 2019-07-07 | Disposition: A | Discharge status: Home / Self Care | Payer: Self-pay | Attending: Family Medicine | Admitting: Family Medicine

## 2019-07-07 ENCOUNTER — Encounter (HOSPITAL_COMMUNITY): Payer: Self-pay | Admitting: Emergency Medicine

## 2019-07-07 ENCOUNTER — Other Ambulatory Visit: Payer: Self-pay

## 2019-07-07 DIAGNOSIS — J020 Streptococcal pharyngitis: Secondary | ICD-10-CM

## 2019-07-07 LAB — POCT RAPID STREP A: Streptococcus, Group A Screen (Direct): POSITIVE — AB

## 2019-07-07 MED ORDER — CEPHALEXIN 500 MG PO CAPS: 500.0000 mg | ORAL_CAPSULE | Freq: Two times a day (BID) | ORAL | 0 refills | Status: AC

## 2019-07-07 NOTE — ED Provider Notes (Signed)
[facility]    CSN: [id number] Arrival date & time: 07/07/19  1439      History   Chief Complaint Chief Complaint  Patient presents with  . Sore Throat    HPI Sean Deleon is a 28 y.o. male.   HPI   Sore throat for 2-3 days Very painful to swallow No fever or chills No body aches No exposure to illness or COVID   Past Medical History:  Diagnosis Date  . Hypertension     There are no active problems to display for this patient.   History reviewed. No pertinent surgical history.     Home Medications    Prior to Admission medications   Medication Sig Start Date End Date Taking? Authorizing Provider  albuterol (PROVENTIL HFA;VENTOLIN HFA) 108 (90 Base) MCG/ACT inhaler Inhale 1-2 puffs into the lungs every 6 (six) hours as needed for wheezing or shortness of breath.    [provider]  cephALEXin (KEFLEX) 500 MG capsule Take 1 capsule (500 mg total) by mouth 2 (two) times daily for 10 days. 07/07/19 07/17/19  Sean Deleon, Heily Carlucci Sue, MD  esomeprazole (NEXIUM) 20 MG capsule Take 1 capsule (20 mg total) by mouth daily at 12 noon. 03/28/17   Charlynne PanderYao, David Hsienta, MD  ranitidine (ZANTAC) 15 MG/ML syrup Take 10 mLs (150 mg total) by mouth 2 (two) times daily. 03/28/17   Charlynne PanderYao, David Hsienta, MD  fluticasone (FLONASE) 50 MCG/ACT nasal spray Place 2 sprays into both nostrils daily. Patient not taking: Reported on 03/17/2017 03/12/17 07/07/19  Muthersbaugh, Boyd KerbsHannah, PA-C    Family History History reviewed. No pertinent family history.  Social History Social History   Tobacco Use  . Smoking status: Current Every Day Smoker    Packs/day: 0.50  . Smokeless tobacco: Never Used  Substance Use Topics  . Alcohol use: No  . Drug use: Yes    Types: Marijuana     Allergies   Penicillins   Review of Systems Review of Systems  Constitutional: Negative for chills and fever.  HENT: Positive for sore throat and trouble swallowing. Negative for ear pain.   Eyes:  Negative for pain and visual disturbance.  Respiratory: Negative for cough and shortness of breath.   Cardiovascular: Negative for chest pain and palpitations.  Gastrointestinal: Negative for abdominal pain and vomiting.  Genitourinary: Negative for dysuria and hematuria.  Musculoskeletal: Negative for arthralgias and back pain.  Skin: Negative for color change and rash.  Neurological: Negative for seizures and syncope.  All other systems reviewed and are negative.    Physical Exam Triage Vital Signs ED Triage Vitals  Enc Vitals Group     BP 07/07/19 1559 127/83     Pulse Rate 07/07/19 1559 77     Resp 07/07/19 1559 12     Temp 07/07/19 1559 98.5 F (36.9 C)     Temp Source 07/07/19 1559 Oral     SpO2 07/07/19 1559 99 %     Weight --      Height --      Head Circumference --      Peak Flow --      Pain Score 07/07/19 1555 8     Pain Loc --      Pain Edu? --      Excl. in GC? --    No data found.  Updated Vital Signs BP 127/83 (BP Location: Left Arm)   Pulse 77   Temp 98.5 F (36.9 C) (Oral)   Resp  12   SpO2 99%     Physical Exam Constitutional:      General: He is not in acute distress.    Appearance: He is well-developed.  HENT:     Head: Normocephalic and atraumatic.     Mouth/Throat:     Mouth: Mucous membranes are moist.     Pharynx: Pharyngeal swelling, oropharyngeal exudate, posterior oropharyngeal erythema and uvula swelling present.     Tonsils: Tonsillar exudate present. 3+ on the right. 3+ on the left.  Eyes:     Conjunctiva/sclera: Conjunctivae normal.     Pupils: Pupils are equal, round, and reactive to light.  Neck:     Musculoskeletal: Normal range of motion.  Cardiovascular:     Rate and Rhythm: Normal rate.  Pulmonary:     Effort: Pulmonary effort is normal. No respiratory distress.  Abdominal:     General: There is no distension.     Palpations: Abdomen is soft.  Musculoskeletal: Normal range of motion.  Lymphadenopathy:      Cervical: Cervical adenopathy present.  Skin:    General: Skin is warm and dry.  Neurological:     Mental Status: He is alert.  Psychiatric:        Mood and Affect: Mood normal.        Behavior: Behavior normal.      UC Treatments / Results  Labs (all labs ordered are listed, but only abnormal results are displayed) Labs Reviewed  POCT RAPID STREP A - Abnormal; Notable for the following components:      Result Value   Streptococcus, Group A Screen (Direct) POSITIVE (*)    All other components within normal limits  CULTURE, GROUP A STREP [facility])    EKG   Radiology No results found.  Procedures Procedures (including critical care time)  Medications Ordered in UC Medications - No data to display  Initial Impression / Assessment and Plan / UC Course  I have reviewed the triage vital signs and the nursing notes.  Pertinent labs & imaging results that were available during my care of the patient were reviewed by me and considered in my medical decision making (see chart for details).     Strep positive Final Clinical Impressions(s) / UC Diagnoses   Final diagnoses:  Strep pharyngitis     Discharge Instructions     It is important that you take 10 full days of antibiotic for STREP tonsillitis Tylenol or ibuprofen for throat pain May try salt water gargles for the pain Call for problems   ED Prescriptions    Medication Sig Dispense Auth. Provider   cephALEXin (KEFLEX) 500 MG capsule Take 1 capsule (500 mg total) by mouth 2 (two) times daily for 10 days. 20 capsule Raylene Everts, MD     Controlled Substance Prescriptions [place] Controlled Substance Registry consulted? Not Applicable   Raylene Everts, MD 07/07/19 [phone]

## 2019-07-07 NOTE — ED Triage Notes (Addendum)
Pt reports a sore throat x2 days. He denies fever, N, V, D, loss of taste or smell, cough,chest pain or SOB.  Pt reports a history of acid reflux and he was supposed to go to ENT before the Coronavirus Pandemic, but all of that got cancelled.

## 2019-07-07 NOTE — Discharge Instructions (Addendum)
It is important that you take 10 full days of antibiotic for STREP tonsillitis Tylenol or ibuprofen for throat pain May try salt water gargles for the pain Call for problems

## 2021-11-08 ENCOUNTER — Encounter (HOSPITAL_COMMUNITY): Payer: Self-pay | Admitting: *Deleted

## 2021-11-08 ENCOUNTER — Ambulatory Visit (INDEPENDENT_AMBULATORY_CARE_PROVIDER_SITE_OTHER): Payer: Self-pay

## 2021-11-08 ENCOUNTER — Other Ambulatory Visit: Payer: Self-pay

## 2021-11-08 ENCOUNTER — Ambulatory Visit (HOSPITAL_COMMUNITY)
Admission: EM | Admit: 2021-11-08 | Discharge: 2021-11-08 | Disposition: A | Discharge status: Home / Self Care | Payer: Self-pay | Attending: Student | Admitting: Student

## 2021-11-08 DIAGNOSIS — S60022A Contusion of left index finger without damage to nail, initial encounter: Secondary | ICD-10-CM

## 2021-11-08 DIAGNOSIS — K219 Gastro-esophageal reflux disease without esophagitis: Secondary | ICD-10-CM

## 2021-11-08 DIAGNOSIS — L03012 Cellulitis of left finger: Secondary | ICD-10-CM

## 2021-11-08 DIAGNOSIS — Z23 Encounter for immunization: Secondary | ICD-10-CM

## 2021-11-08 MED ORDER — OMEPRAZOLE 20 MG PO CPDR: 20.0000 mg | DELAYED_RELEASE_CAPSULE | Freq: Every day | ORAL | 2 refills | Status: AC

## 2021-11-08 MED ORDER — ONDANSETRON 8 MG PO TBDP: 8.0000 mg | ORAL_TABLET | Freq: Three times a day (TID) | ORAL | 0 refills | Status: AC | PRN

## 2021-11-08 MED ORDER — TETANUS-DIPHTH-ACELL PERTUSSIS 5-2.5-18.5 LF-MCG/0.5 IM SUSY
0.5000 mL | PREFILLED_SYRINGE | Freq: Once | INTRAMUSCULAR | Status: AC
  Administered 2021-11-08: 0.5 mL via INTRAMUSCULAR

## 2021-11-08 MED ORDER — TETANUS-DIPHTH-ACELL PERTUSSIS 5-2.5-18.5 LF-MCG/0.5 IM SUSY
PREFILLED_SYRINGE | INTRAMUSCULAR | Status: AC
  Filled 2021-11-08: qty 0.5

## 2021-11-08 MED ORDER — DOXYCYCLINE HYCLATE 100 MG PO CAPS: 100.0000 mg | ORAL_CAPSULE | Freq: Two times a day (BID) | ORAL | 0 refills | Status: AC

## 2021-11-08 NOTE — ED Triage Notes (Signed)
Pt reports finger injury 3 days ago. Pt also has acid reflux.

## 2021-11-08 NOTE — Discharge Instructions (Addendum)
-  Your xray looks good! Use the finger splint while pain persists.  -Doxycycline twice daily for 7 days.  Make sure to wear sunscreen while spending time outside while on this medication as it can increase your chance of sunburn. You can take this medication with food if you have a sensitive stomach. -Prilosec once daily.  -Take the Zofran (ondansetron) up to 3 times daily for nausea and vomiting. -Limit spicy foods, ibuprofen/NSAIDs, alcohol, etc. Try eating multiple smaller meals throughout the day instead of 3 big meals. Drink plenty of fluids.

## 2021-11-08 NOTE — ED Provider Notes (Signed)
[facility]    CSN: [id number] Arrival date & time: 11/08/21  1422      History   Chief Complaint Chief Complaint  Patient presents with   Finger Injury   Gastroesophageal Reflux    HPI Sean Deleon is a 30 y.o. male presenting with GERD and swollen finger. Medical history GERD.  Describes 2 years of reflux, worse over the last week as he has been sick.  Previously controlled on Nexium, but has not been able to get this for 2 years.  Describes epigastric burning, worse with empty stomach, and states he has not eaten anything for about 4 days due to being sick.  Some nausea and 1 episode of vomiting 1 day ago.  Denies diarrhea.  Also states that he slammed the left index finger in a car door about 4 days ago, this created a small opening in the skin which now seems to be infected.  Some pain and swelling over the PIP joint, but this is not very painful per patient.  States he tried to drain this at home but cannot get any pus out.  He is right-handed.  Denies fever/chills.  Tdap not up-to-date.  He is not immunocompromised.  HPI  Past Medical History:  Diagnosis Date   Hypertension     There are no problems to display for this patient.   History reviewed. No pertinent surgical history.     Home Medications    Prior to Admission medications   Medication Sig Start Date End Date Taking? Authorizing Provider  doxycycline (VIBRAMYCIN) 100 MG capsule Take 1 capsule (100 mg total) by mouth 2 (two) times daily for 7 days. 11/08/21 11/15/21 Yes Rhys Martini, PA-C  omeprazole (PRILOSEC) 20 MG capsule Take 1 capsule (20 mg total) by mouth daily. 11/08/21  Yes Rhys Martini, PA-C  ondansetron (ZOFRAN-ODT) 8 MG disintegrating tablet Take 1 tablet (8 mg total) by mouth every 8 (eight) hours as needed for nausea or vomiting. 11/08/21  Yes Rhys Martini, PA-C  fluticasone (FLONASE) 50 MCG/ACT nasal spray Place 2 sprays into both nostrils daily. Patient not taking:  Reported on 03/17/2017 03/12/17 07/07/19  Muthersbaugh, Boyd Kerbs    Family History History reviewed. No pertinent family history.  Social History Social History   Tobacco Use   Smoking status: Every Day    Packs/day: 0.50    Types: Cigarettes   Smokeless tobacco: Never  Substance Use Topics   Alcohol use: No   Drug use: Yes    Types: Marijuana     Allergies   Penicillins   Review of Systems Review of Systems  Constitutional:  Negative for appetite change, chills and fever.  HENT:  Negative for congestion, ear pain, rhinorrhea, sinus pressure, sinus pain and sore throat.   Eyes:  Negative for redness and visual disturbance.  Respiratory:  Negative for cough, chest tightness, shortness of breath and wheezing.   Cardiovascular:  Negative for chest pain and palpitations.  Gastrointestinal:  Positive for abdominal pain and nausea. Negative for constipation, diarrhea and vomiting.  Genitourinary:  Negative for dysuria, frequency and urgency.  Musculoskeletal:  Negative for myalgias.       L index finger pain  Neurological:  Negative for dizziness, weakness and headaches.  Psychiatric/Behavioral:  Negative for confusion.   All other systems reviewed and are negative.   Physical Exam Triage Vital Signs ED Triage Vitals  Enc Vitals Group     BP      Pulse  Resp      Temp      Temp src      SpO2      Weight      Height      Head Circumference      Peak Flow      Pain Score      Pain Loc      Pain Edu?      Excl. in [place]?    No data found.  Updated Vital Signs BP (!) 146/102   Pulse 85   Temp 98.1 F (36.7 C)   Resp 16   SpO2 96%   Visual Acuity Right Eye Distance:   Left Eye Distance:   Bilateral Distance:    Right Eye Near:   Left Eye Near:    Bilateral Near:     Physical Exam Vitals reviewed.  Constitutional:      General: He is not in acute distress.    Appearance: Normal appearance. He is not ill-appearing.  HENT:     Head:  Normocephalic and atraumatic.     Mouth/Throat:     Mouth: Mucous membranes are moist.     Comments: Moist mucous membranes Eyes:     Extraocular Movements: Extraocular movements intact.     Pupils: Pupils are equal, round, and reactive to light.  Cardiovascular:     Rate and Rhythm: Normal rate and regular rhythm.     Heart sounds: Normal heart sounds.  Pulmonary:     Effort: Pulmonary effort is normal.     Breath sounds: Normal breath sounds. No wheezing, rhonchi or rales.  Abdominal:     General: Bowel sounds are normal. There is no distension.     Palpations: Abdomen is soft. There is no mass.     Tenderness: There is abdominal tenderness in the epigastric area. There is no right CVA tenderness, left CVA tenderness, guarding or rebound. Negative signs include Murphy's sign, Rovsing's sign and McBurney's sign.     Comments: TTP epigastric region without guarding rebound mass hernia. Comfortable throughout exam.  Musculoskeletal:     Comments: See image below.  L index finger TTP PIP joint with some effusion. Some erythema palmar aspect. No fluctuance or induration. ROM intact and without pain, grip strength 5/5, cap refill <2 seconds. Radial pulse 2+. No snuffbox tenderness. No nail injury.  Skin:    General: Skin is warm.     Capillary Refill: Capillary refill takes less than 2 seconds.     Comments: Good skin turgor  Neurological:     General: No focal deficit present.     Mental Status: He is alert and oriented to person, place, and time.  Psychiatric:        Mood and Affect: Mood normal.        Behavior: Behavior normal.         UC Treatments / Results  Labs (all labs ordered are listed, but only abnormal results are displayed) Labs Reviewed - No data to display  EKG   Radiology DG Hand Complete Left  Result Date: 11/08/2021 CLINICAL DATA:  Infection LEFT finger PIP joint for 4 days after slamming finger in door EXAM: LEFT HAND - COMPLETE 3+ VIEW COMPARISON:   07/19/2012 FINDINGS: Soft tissue swelling LEFT index finger centered at proximal phalanx and PIP joint. Osseous mineralization normal. Joint spaces preserved. No acute fracture, dislocation, or bone destruction. IMPRESSION: Soft tissue swelling without acute osseous abnormalities. Electronically Signed   By: Crist Infante.D.  On: 11/08/2021 16:16    Procedures Procedures (including critical care time)  Medications Ordered in UC Medications  Tdap (BOOSTRIX) injection 0.5 mL (0.5 mLs Intramuscular Given 11/08/21 1616)    Initial Impression / Assessment and Plan / UC Course  I have reviewed the triage vital signs and the nursing notes.  Pertinent labs & imaging results that were available during my care of the patient were reviewed by me and considered in my medical decision making (see chart for details).     This patient is a very pleasant 30 y.o. year old male presenting with GERD and L index finger contusion with cellulitis. Neurovascularly intact.   Tdap administerd today   Prilosec and Zofran ODT sent.   Xray L hand - Soft tissue swelling without acute osseous abnormalities.  Splint provided at patient request. Doxycycline sent as he is penicillin allergic. Wound care discussed.   ED return precautions discussed. Patient verbalizes understanding and agreement.      Final Clinical Impressions(s) / UC Diagnoses   Final diagnoses:  Gastroesophageal reflux disease without esophagitis  Cellulitis of left index finger  Need for Tdap vaccination     Discharge Instructions      -Your xray looks good! Use the finger splint while pain persists.  -Doxycycline twice daily for 7 days.  Make sure to wear sunscreen while spending time outside while on this medication as it can increase your chance of sunburn. You can take this medication with food if you have a sensitive stomach. -Prilosec once daily.  -Take the Zofran (ondansetron) up to 3 times daily for nausea and  vomiting. -Limit spicy foods, ibuprofen/NSAIDs, alcohol, etc. Try eating multiple smaller meals throughout the day instead of 3 big meals. Drink plenty of fluids.       ED Prescriptions     Medication Sig Dispense Auth. Provider   doxycycline (VIBRAMYCIN) 100 MG capsule Take 1 capsule (100 mg total) by mouth 2 (two) times daily for 7 days. 14 capsule Hazel Sams, PA-C   omeprazole (PRILOSEC) 20 MG capsule Take 1 capsule (20 mg total) by mouth daily. 30 capsule Marin Roberts E, PA-C   ondansetron (ZOFRAN-ODT) 8 MG disintegrating tablet Take 1 tablet (8 mg total) by mouth every 8 (eight) hours as needed for nausea or vomiting. 20 tablet Hazel Sams, PA-C      PDMP not reviewed this encounter.   Hazel Sams, PA-C 11/08/21 1645
